# Patient Record
Sex: Male | Born: 1975 | Race: White | Hispanic: No | Marital: Married | Smoking: Former smoker
Health system: Southern US, Community
[De-identification: ages and names within clinical notes are randomized; demographics above are authoritative.]

## PROBLEM LIST (undated history)

## (undated) DIAGNOSIS — K2270 Barrett's esophagus without dysplasia: Secondary | ICD-10-CM

## (undated) DIAGNOSIS — I272 Pulmonary hypertension, unspecified: Secondary | ICD-10-CM

## (undated) DIAGNOSIS — K219 Gastro-esophageal reflux disease without esophagitis: Secondary | ICD-10-CM

## (undated) DIAGNOSIS — Z8719 Personal history of other diseases of the digestive system: Secondary | ICD-10-CM

## (undated) DIAGNOSIS — R079 Chest pain, unspecified: Secondary | ICD-10-CM

## (undated) HISTORY — PX: KNEE SURGERY: SHX244

## (undated) HISTORY — PX: WISDOM TOOTH EXTRACTION: SHX21

## (undated) HISTORY — PX: TUMOR REMOVAL: SHX12

---

## 2016-02-09 ENCOUNTER — Encounter (HOSPITAL_COMMUNITY): Payer: Self-pay | Admitting: Emergency Medicine

## 2016-02-09 ENCOUNTER — Emergency Department (HOSPITAL_COMMUNITY): Payer: 59

## 2016-02-09 ENCOUNTER — Observation Stay (HOSPITAL_COMMUNITY): Payer: 59

## 2016-02-09 ENCOUNTER — Observation Stay (HOSPITAL_COMMUNITY)
Admission: EM | Admit: 2016-02-09 | Discharge: 2016-02-10 | Disposition: A | Discharge status: Home / Self Care | Payer: 59 | Attending: Family Medicine | Admitting: Family Medicine

## 2016-02-09 DIAGNOSIS — K449 Diaphragmatic hernia without obstruction or gangrene: Secondary | ICD-10-CM | POA: Insufficient documentation

## 2016-02-09 DIAGNOSIS — E860 Dehydration: Secondary | ICD-10-CM | POA: Diagnosis not present

## 2016-02-09 DIAGNOSIS — K529 Noninfective gastroenteritis and colitis, unspecified: Secondary | ICD-10-CM | POA: Diagnosis not present

## 2016-02-09 DIAGNOSIS — K2270 Barrett's esophagus without dysplasia: Secondary | ICD-10-CM | POA: Diagnosis not present

## 2016-02-09 DIAGNOSIS — K210 Gastro-esophageal reflux disease with esophagitis: Secondary | ICD-10-CM

## 2016-02-09 DIAGNOSIS — R000 Tachycardia, unspecified: Secondary | ICD-10-CM | POA: Diagnosis not present

## 2016-02-09 DIAGNOSIS — K219 Gastro-esophageal reflux disease without esophagitis: Secondary | ICD-10-CM | POA: Insufficient documentation

## 2016-02-09 HISTORY — DX: Barrett's esophagus without dysplasia: K22.70

## 2016-02-09 LAB — COMPREHENSIVE METABOLIC PANEL
ALBUMIN: 4.4 g/dL (ref 3.5–5.0)
ALT: 34 U/L (ref 17–63)
ANION GAP: 10 (ref 5–15)
AST: 32 U/L (ref 15–41)
Alkaline Phosphatase: 105 U/L (ref 38–126)
BUN: 17 mg/dL (ref 6–20)
CHLORIDE: 104 mmol/L (ref 101–111)
CO2: 22 mmol/L (ref 22–32)
Calcium: 9.7 mg/dL (ref 8.9–10.3)
Creatinine, Ser: 1.17 mg/dL (ref 0.61–1.24)
GFR calc Af Amer: >60 mL/min (ref >60)
GFR calc non Af Amer: >60 mL/min (ref >60)
GLUCOSE: 113 mg/dL — AB (ref 65–99)
Potassium: 4.3 mmol/L (ref 3.5–5.1)
SODIUM: 136 mmol/L (ref 135–145)
Total Bilirubin: 0.8 mg/dL (ref 0.3–1.2)
Total Protein: 8 g/dL (ref 6.5–8.1)

## 2016-02-09 LAB — URINALYSIS, ROUTINE W REFLEX MICROSCOPIC
Bilirubin Urine: NEGATIVE
GLUCOSE, UA: NEGATIVE mg/dL
KETONES UR: NEGATIVE mg/dL
LEUKOCYTES UA: NEGATIVE
NITRITE: NEGATIVE
PH: 6.5 (ref 5.0–8.0)
PROTEIN: NEGATIVE mg/dL
Specific Gravity, Urine: 1.033 — ABNORMAL HIGH (ref 1.005–1.030)

## 2016-02-09 LAB — TROPONIN I
Troponin I: <0.03 ng/mL (ref <0.03)
Troponin I: <0.03 ng/mL (ref <0.03)

## 2016-02-09 LAB — CBC WITH DIFFERENTIAL/PLATELET
BASOS ABS: 0 10*3/uL (ref 0.0–0.1)
BASOS PCT: 0 %
EOS ABS: 0.2 10*3/uL (ref 0.0–0.7)
Eosinophils Relative: 1 %
HEMATOCRIT: 45.7 % (ref 39.0–52.0)
HEMOGLOBIN: 15.5 g/dL (ref 13.0–17.0)
LYMPHS PCT: 9 %
Lymphs Abs: 1.7 10*3/uL (ref 0.7–4.0)
MCH: 30.5 pg (ref 26.0–34.0)
MCHC: 33.9 g/dL (ref 30.0–36.0)
MCV: 89.8 fL (ref 78.0–100.0)
MONOS PCT: 8 %
Monocytes Absolute: 1.5 10*3/uL — ABNORMAL HIGH (ref 0.1–1.0)
NEUTROS ABS: 15.1 10*3/uL — AB (ref 1.7–7.7)
NEUTROS PCT: 82 %
Platelets: 322 10*3/uL (ref 150–400)
RBC: 5.09 MIL/uL (ref 4.22–5.81)
RDW: 12.8 % (ref 11.5–15.5)
WBC: 18.5 10*3/uL — ABNORMAL HIGH (ref 4.0–10.5)

## 2016-02-09 LAB — URINE MICROSCOPIC-ADD ON: Squamous Epithelial / LPF: NONE SEEN

## 2016-02-09 LAB — MAGNESIUM: MAGNESIUM: 2.1 mg/dL (ref 1.7–2.4)

## 2016-02-09 LAB — C DIFFICILE QUICK SCREEN W PCR REFLEX
C DIFFICILE (CDIFF) INTERP: NOT DETECTED
C DIFFICILE (CDIFF) TOXIN: NEGATIVE
C DIFFICLE (CDIFF) ANTIGEN: NEGATIVE

## 2016-02-09 LAB — OCCULT BLOOD X 1 CARD TO LAB, STOOL: FECAL OCCULT BLD: POSITIVE — AB

## 2016-02-09 LAB — I-STAT CG4 LACTIC ACID, ED: LACTIC ACID, VENOUS: 0.97 mmol/L (ref 0.5–1.9)

## 2016-02-09 LAB — LIPASE, BLOOD: Lipase: 23 U/L (ref 11–51)

## 2016-02-09 LAB — D-DIMER, QUANTITATIVE: D-Dimer, Quant: 1.76 ug/mL-FEU — ABNORMAL HIGH (ref 0.00–0.50)

## 2016-02-09 LAB — TSH: TSH: 0.472 u[IU]/mL (ref 0.350–4.500)

## 2016-02-09 MED ORDER — IOPAMIDOL (ISOVUE-300) INJECTION 61%
INTRAVENOUS | Status: AC
  Filled 2016-02-09: qty 100

## 2016-02-09 MED ORDER — METRONIDAZOLE IN NACL 5-0.79 MG/ML-% IV SOLN: 500.0000 mg | Freq: Once | INTRAVENOUS | Status: DC

## 2016-02-09 MED ORDER — ACETAMINOPHEN 325 MG PO TABS
650.0000 mg | ORAL_TABLET | Freq: Four times a day (QID) | ORAL | Status: DC | PRN
  Administered 2016-02-09: 650 mg via ORAL
  Filled 2016-02-09: qty 2

## 2016-02-09 MED ORDER — ACETAMINOPHEN 650 MG RE SUPP: 650.0000 mg | Freq: Four times a day (QID) | RECTAL | Status: DC | PRN

## 2016-02-09 MED ORDER — ONDANSETRON HCL 4 MG/2ML IJ SOLN: 4.0000 mg | Freq: Four times a day (QID) | INTRAMUSCULAR | Status: DC | PRN

## 2016-02-09 MED ORDER — HYDROMORPHONE HCL 1 MG/ML IJ SOLN
1.0000 mg | Freq: Once | INTRAMUSCULAR | Status: AC
  Administered 2016-02-09: 1 mg via INTRAVENOUS
  Filled 2016-02-09: qty 1

## 2016-02-09 MED ORDER — SODIUM CHLORIDE 0.9 % IJ SOLN
INTRAMUSCULAR | Status: AC
  Administered 2016-02-09: 21:00:00
  Filled 2016-02-09: qty 50

## 2016-02-09 MED ORDER — SODIUM CHLORIDE 0.9 % IV BOLUS (SEPSIS)
2000.0000 mL | Freq: Once | INTRAVENOUS | Status: AC
  Administered 2016-02-09: 2000 mL via INTRAVENOUS

## 2016-02-09 MED ORDER — SODIUM CHLORIDE 0.9 % IV SOLN
INTRAVENOUS | Status: DC
  Administered 2016-02-09 – 2016-02-10 (×3): via INTRAVENOUS

## 2016-02-09 MED ORDER — ONDANSETRON HCL 4 MG PO TABS: 4.0000 mg | ORAL_TABLET | Freq: Four times a day (QID) | ORAL | Status: DC | PRN

## 2016-02-09 MED ORDER — SODIUM CHLORIDE 0.9 % IV BOLUS (SEPSIS)
1000.0000 mL | Freq: Once | INTRAVENOUS | Status: AC
  Administered 2016-02-09: 1000 mL via INTRAVENOUS

## 2016-02-09 MED ORDER — ONDANSETRON HCL 4 MG/2ML IJ SOLN
4.0000 mg | Freq: Once | INTRAMUSCULAR | Status: AC
  Administered 2016-02-09: 4 mg via INTRAVENOUS
  Filled 2016-02-09: qty 2

## 2016-02-09 MED ORDER — CIPROFLOXACIN IN D5W 400 MG/200ML IV SOLN
400.0000 mg | Freq: Once | INTRAVENOUS | Status: DC
  Filled 2016-02-09: qty 200

## 2016-02-09 MED ORDER — PANTOPRAZOLE SODIUM 40 MG PO TBEC
40.0000 mg | DELAYED_RELEASE_TABLET | Freq: Every day | ORAL | Status: DC
  Administered 2016-02-09 – 2016-02-10 (×2): 40 mg via ORAL
  Filled 2016-02-09 (×2): qty 1

## 2016-02-09 MED ORDER — CIPROFLOXACIN IN D5W 400 MG/200ML IV SOLN
400.0000 mg | Freq: Once | INTRAVENOUS | Status: AC
  Administered 2016-02-09: 400 mg via INTRAVENOUS

## 2016-02-09 MED ORDER — LORAZEPAM 2 MG/ML IJ SOLN
1.0000 mg | Freq: Once | INTRAMUSCULAR | Status: AC
  Administered 2016-02-09: 1 mg via INTRAVENOUS
  Filled 2016-02-09: qty 1

## 2016-02-09 MED ORDER — GI COCKTAIL ~~LOC~~
ORAL | Status: AC
  Administered 2016-02-09: 30 mL via ORAL
  Filled 2016-02-09: qty 30

## 2016-02-09 MED ORDER — METRONIDAZOLE IN NACL 5-0.79 MG/ML-% IV SOLN: 500.0000 mg | Freq: Three times a day (TID) | INTRAVENOUS | Status: DC

## 2016-02-09 MED ORDER — IOPAMIDOL (ISOVUE-370) INJECTION 76%
100.0000 mL | Freq: Once | INTRAVENOUS | Status: AC | PRN
  Administered 2016-02-09: 80 mL via INTRAVENOUS

## 2016-02-09 MED ORDER — METRONIDAZOLE IN NACL 5-0.79 MG/ML-% IV SOLN
500.0000 mg | Freq: Three times a day (TID) | INTRAVENOUS | Status: DC
  Administered 2016-02-09 – 2016-02-10 (×3): 500 mg via INTRAVENOUS
  Filled 2016-02-09 (×3): qty 100

## 2016-02-09 MED ORDER — SODIUM CHLORIDE 0.9 % IJ SOLN
INTRAMUSCULAR | Status: AC
  Filled 2016-02-09: qty 50

## 2016-02-09 MED ORDER — LOPERAMIDE HCL 2 MG PO CAPS
4.0000 mg | ORAL_CAPSULE | Freq: Once | ORAL | Status: AC
  Administered 2016-02-09: 4 mg via ORAL
  Filled 2016-02-09: qty 2

## 2016-02-09 MED ORDER — GI COCKTAIL ~~LOC~~
30.0000 mL | Freq: Once | ORAL | Status: AC
  Administered 2016-02-09: 30 mL via ORAL

## 2016-02-09 MED ORDER — METRONIDAZOLE IN NACL 5-0.79 MG/ML-% IV SOLN
500.0000 mg | Freq: Once | INTRAVENOUS | Status: DC
  Filled 2016-02-09: qty 100

## 2016-02-09 MED ORDER — IOPAMIDOL (ISOVUE-370) INJECTION 76%
INTRAVENOUS | Status: AC
  Filled 2016-02-09: qty 100

## 2016-02-09 MED ORDER — CIPROFLOXACIN IN D5W 400 MG/200ML IV SOLN: 400.0000 mg | Freq: Once | INTRAVENOUS | Status: DC

## 2016-02-09 MED ORDER — HYDROCODONE-ACETAMINOPHEN 5-325 MG PO TABS
1.0000 | ORAL_TABLET | ORAL | Status: DC | PRN
  Administered 2016-02-09 – 2016-02-10 (×5): 2 via ORAL
  Filled 2016-02-09 (×5): qty 2

## 2016-02-09 MED ORDER — METRONIDAZOLE IN NACL 5-0.79 MG/ML-% IV SOLN
500.0000 mg | Freq: Once | INTRAVENOUS | Status: AC
  Administered 2016-02-09: 500 mg via INTRAVENOUS

## 2016-02-09 MED ORDER — CIPROFLOXACIN IN D5W 400 MG/200ML IV SOLN
400.0000 mg | Freq: Two times a day (BID) | INTRAVENOUS | Status: DC
  Administered 2016-02-09 – 2016-02-10 (×2): 400 mg via INTRAVENOUS
  Filled 2016-02-09 (×2): qty 200

## 2016-02-09 MED ORDER — IOPAMIDOL (ISOVUE-300) INJECTION 61%
100.0000 mL | Freq: Once | INTRAVENOUS | Status: AC | PRN
  Administered 2016-02-09: 100 mL via INTRAVENOUS

## 2016-02-09 NOTE — ED Triage Notes (Signed)
Pt reports having RUQ pain that started yesterday around 1200 then began having chest pain substernally 2 hours ago. Pt reports prior hx of gallbladder stones and also 20 episodes of diarrhea in the last 24hr.

## 2016-02-09 NOTE — ED Provider Notes (Signed)
[facility]-EMERGENCY DEPT Provider Note   CSN: [id number] Arrival date & time: 02/09/16  4098  By signing my name below, I, Octavia Heir, attest that this documentation has been prepared under the direction and in the presence of Derwood Kaplan, MD.  Electronically Signed: Octavia Heir, ED Scribe. 02/09/16. 3:09 AM.    History   Chief Complaint Chief Complaint  Patient presents with  . Abdominal Pain    The history is provided by the patient. No language interpreter was used.   HPI Comments: Victor David is a 40 y.o. male who has a PMhx of GERD presents to the Emergency Department complaining of sudden onset, gradual worsening, waxing and waning, epigastric abdominal pain ~ 12 pm this afternoon. Associated nausea, diarrhea x 20, and diaphoresis. Pt describes his diarrhea as watery and pasty. He states his pain started off as a dull pain and has become gradually worse ~ 3 hours ago. Pt states his pain became increasingly worse after pt ate dinner tonight ~ 5 pm. He reports eating ribs. He notes he has frequent abdominal problems associated with his GERD. Pt notes having discomfort every time he eats but has not had prior episodes where the pain is "debilitating". Pt has no abdominal surgical hx. Denies vomiting, pain with breathing, and hx of DM. Pt has a family hx of MI, CVA, and DM.  Past Medical History:  Diagnosis Date  . Barrett's esophagus     Patient Active Problem List   Diagnosis Date Noted  . Colitis 02/09/2016  . Tachycardia 02/09/2016  . Dehydration 02/09/2016  . GERD (gastroesophageal reflux disease) 02/09/2016    History reviewed. No pertinent surgical history.     Home Medications    Prior to Admission medications   Medication Sig Start Date End Date Taking? Authorizing Provider  esomeprazole (NEXIUM) 20 MG packet Take 20 mg by mouth daily before breakfast.   Yes Historical Provider, MD  famotidine (PEPCID AC) 10 MG chewable tablet Chew 10 mg by mouth 2  (two) times daily as needed (stomach pain).   Yes Historical Provider, MD  ibuprofen (ADVIL,MOTRIN) 200 MG tablet Take 400 mg by mouth every 6 (six) hours as needed for headache, mild pain or moderate pain.   Yes Historical Provider, MD  ciprofloxacin (CIPRO) 500 MG tablet Take 1 tablet (500 mg total) by mouth 2 (two) times daily. 02/10/16   Tyrone Nine, MD  metroNIDAZOLE (FLAGYL) 500 MG tablet Take 1 tablet (500 mg total) by mouth every 8 (eight) hours. 02/10/16   Tyrone Nine, MD    Family History History reviewed. No pertinent family history.  Social History Social History  Substance Use Topics  . Smoking status: Never Smoker  . Smokeless tobacco: Never Used  . Alcohol use No     Allergies   Patient has no known allergies.   Review of Systems Review of Systems  A complete 10 system review of systems was obtained and all systems are negative except as noted in the HPI and PMH.   Physical Exam Updated Vital Signs BP 124/74 (BP Location: Left Arm)   Pulse 65   Temp 97.5 F (36.4 C) (Oral)   Resp 18   Ht 5\' 7"  (1.702 m)   Wt 263 lb 14.3 oz (119.7 kg)   SpO2 100%   BMI 41.33 kg/m   Physical Exam  Constitutional: He is oriented to person, place, and time. He appears well-developed and well-nourished.  HENT:  Head: Normocephalic.  Eyes: EOM are normal.  Neck: Normal range of motion.  Cardiovascular: Regular rhythm.  Tachycardia present.   Pulmonary/Chest: Effort normal.  Anterior lungs are clear  Abdominal: He exhibits no distension. There is tenderness. There is guarding. There is no rebound.  Pt has tenderness to epigastrium and RUQ with voluntary guarding, no rebound.  Musculoskeletal: Normal range of motion.  Neurological: He is alert and oriented to person, place, and time.  Psychiatric: He has a normal mood and affect.  Nursing note and vitals reviewed.    ED Treatments / Results  DIAGNOSTIC STUDIES: Oxygen Saturation is 98% on RA, normal by my  interpretation.  COORDINATION OF CARE:  3:07 AM Discussed treatment plan with pt at bedside and pt agreed to plan.  Labs (all labs ordered are listed, but only abnormal results are displayed) Labs Reviewed  COMPREHENSIVE METABOLIC PANEL - Abnormal; Notable for the following:       Result Value   Glucose, Bld 113 (*)    All other components within normal limits  URINALYSIS, ROUTINE W REFLEX MICROSCOPIC (NOT AT [facility]) - Abnormal; Notable for the following:    Specific Gravity, Urine 1.033 (*)    Hgb urine dipstick SMALL (*)    All other components within normal limits  CBC WITH DIFFERENTIAL/PLATELET - Abnormal; Notable for the following:    WBC 18.5 (*)    Neutro Abs 15.1 (*)    Monocytes Absolute 1.5 (*)    All other components within normal limits  URINE MICROSCOPIC-ADD ON - Abnormal; Notable for the following:    Bacteria, UA RARE (*)    All other components within normal limits  D-DIMER, QUANTITATIVE (NOT AT [facility]) - Abnormal; Notable for the following:    D-Dimer, Quant 1.76 (*)    All other components within normal limits  OCCULT BLOOD X 1 CARD TO LAB, STOOL - Abnormal; Notable for the following:    Fecal Occult Bld POSITIVE (*)    All other components within normal limits  BASIC METABOLIC PANEL - Abnormal; Notable for the following:    Glucose, Bld 105 (*)    Calcium 8.6 (*)    All other components within normal limits  URINE CULTURE  OVA + PARASITE EXAM  C DIFFICILE QUICK SCREEN W PCR REFLEX  LIPASE, BLOOD  TROPONIN I  TSH  MAGNESIUM  TROPONIN I  TROPONIN I  TROPONIN I  CBC  O&P RESULT  FECAL LACTOFERRIN, QUANT  I-STAT CHEM 8, ED  I-STAT CG4 LACTIC ACID, ED    EKG  EKG Interpretation  Date/Time:  Sunday February 09 2016 02:32:12 EST Ventricular Rate:  125 PR Interval:    QRS Duration: 99 QT Interval:  310 QTC Calculation: 447 R Axis:   0 Text Interpretation:  Sinus tachycardia Probable left atrial enlargement No acute changes No old tracing to  compare Confirmed by Rhunette CroftNANAVATI, MD, Janey GentaANKIT [phone]([id number]) on 02/09/2016 3:10:23 AM       Radiology No results found.  Procedures Procedures (including critical care time)  Medications Ordered in ED Medications  iopamidol (ISOVUE-300) 61 % injection (not administered)  sodium chloride 0.9 % injection (not administered)  iopamidol (ISOVUE-370) 76 % injection (not administered)  sodium chloride 0.9 % bolus 2,000 mL (0 mLs Intravenous Stopped 02/09/16 0803)  HYDROmorphone (DILAUDID) injection 1 mg (1 mg Intravenous Given 02/09/16 0317)  ondansetron (ZOFRAN) injection 4 mg (4 mg Intravenous Given 02/09/16 0318)  iopamidol (ISOVUE-300) 61 % injection 100 mL (100 mLs Intravenous Contrast Given 02/09/16 0430)  HYDROmorphone (DILAUDID) injection 1 mg (1 mg  Intravenous Given 02/09/16 0526)  ciprofloxacin (CIPRO) IVPB 400 mg (0 mg Intravenous Stopped 02/09/16 0706)  metroNIDAZOLE (FLAGYL) IVPB 500 mg (0 mg Intravenous Stopped 02/09/16 0803)  gi cocktail (Maalox,Lidocaine,Donnatal) (30 mLs Oral Given 02/09/16 0542)  LORazepam (ATIVAN) injection 1 mg (1 mg Intravenous Given 02/09/16 0717)  sodium chloride 0.9 % bolus 1,000 mL (1,000 mLs Intravenous Given 02/09/16 1003)  loperamide (IMODIUM) capsule 4 mg (4 mg Oral Given 02/09/16 1345)  iopamidol (ISOVUE-370) 76 % injection 100 mL (80 mLs Intravenous Contrast Given 02/09/16 2013)  sodium chloride 0.9 % injection (  Given 02/09/16 2127)     Initial Impression / Assessment and Plan / ED Course  I have reviewed the triage vital signs and the nursing notes.  Pertinent labs & imaging results that were available during my care of the patient were reviewed by me and considered in my medical decision making (see chart for details).  Clinical Course as of Feb 14 1438  Wynelle LinkSun Feb 09, 2016  [id number] CT scan shows possible colitis. Pt did mention 20 loose BM, so colitis in deep possible. With WC at 18, we will give antibiotics. Still tachycardic. Will give ativan 1 mg,  reports hx of anxiety. No chest pain even now. Repeat exam unchanged. Pt is having "waves of pain." With neg pancreatitis/perforation/cholecystitis - I think esophagitis/gastritis possible, especially since he has hx of barrets.    [AN]  0801 HR in the 130s despite pain meds, ativan. Pt continues to have abd pain. We will admit for pain obs - tachycardia/leukocytosis and persistent pain.  Mother had PE. No signs of DVT. Pt has no hx of PE, DVT and denies any exogenous gormone use, long distance travels or surgery in the past 6 weeks, active cancer, recent immobilization. We will get dimer as a screening exam. IF DIMER IS + - PT WILL NEED VQ SCAN.   [AN]    Clinical Course User Index [AN] Derwood KaplanAnkit Dulce Martian, MD    I personally performed the services described in this documentation, which was scribed in my presence. The recorded information has been reviewed and is accurate.   Final Clinical Impressions(s) / ED Diagnoses   Final diagnoses:  Colitis, acute  Tachycardia    Pt comes in w/ abd pain. Hx of gall stones and recurrent episodes of GERD. Pt is tachycardic and has epigastric tenderness, RUQ tenderness. He is diaphoretic  DDx includes: Pancreatitis Hepatobiliary pathology including cholecystitis Gastritis/PUD SBO ACS syndrome Aortic Dissection Ureteral colic  CT abdomen ordered. Pain control started.   New Prescriptions Discharge Medication List as of 02/10/2016  2:11 PM    START taking these medications   Details  ciprofloxacin (CIPRO) 500 MG tablet Take 1 tablet (500 mg total) by mouth 2 (two) times daily., Starting Mon 02/10/2016, Normal    metroNIDAZOLE (FLAGYL) 500 MG tablet Take 1 tablet (500 mg total) by mouth every 8 (eight) hours., Starting Mon 02/10/2016, Normal         Derwood KaplanAnkit Shyloh Derosa, MD 02/15/16 1440

## 2016-02-09 NOTE — ED Notes (Signed)
Secretary reports that 4th floor has not approved bed that was assigned

## 2016-02-09 NOTE — ED Notes (Signed)
Phlebotomy at bedside to collect d-dimer

## 2016-02-09 NOTE — Progress Notes (Signed)
Pharmacy Antibiotic Note  Eldridge AbrahamsWesley Henken is a 40 y.o. male admitted on 02/09/2016 with intra-abdominal infection.  Pharmacy has been consulted for Metronidazole dosing.  Plan: Metronidazole 500mg  IV q8h Anticipate dosage to remain stable and need for further dosage adjustment appears unlikely at present.   Will sign off at this time.  Please reconsult if a change in clinical status warrants re-evaluation of dosage.    Height: 6' (182.9 cm) Weight: 257 lb (116.6 kg) IBW/kg (Calculated) : 77.6  Temp (24hrs), Avg:99.2 F (37.3 C), Min:99.2 F (37.3 C), Max:99.2 F (37.3 C)   Recent Labs Lab 02/09/16 0332 02/09/16 0517  WBC 18.5*  --   CREATININE 1.17  --   LATICACIDVEN  --  0.97    Estimated Creatinine Clearance: 110.6 mL/min (by C-G formula based on SCr of 1.17 mg/dL).    No Known Allergies  Antimicrobials this admission: 11/19 Cipro x 1 dose  11/19 Metronidazole >>    Thank you for allowing pharmacy to be a part of this patient's care.  Maryellen PilePoindexter, Maicol Bowland Trefz, PharmD 02/09/2016 6:04 AM

## 2016-02-09 NOTE — H&P (Signed)
History and Physical        Hospital Admission Note Date: 02/09/2016  Patient name: Victor David Medical record number: [id number] Date of birth: 12-12-1975 Age: 40 y.o. Gender: male  PCP: No primary care provider on file.   Referring physician:  Dr Rhunette Croft  Patient coming from: home    Chief Complaint:  Multiple episodes of diarrhea with abdominal cramping since yesterday  HPI: Patient is a 40 year old male with history of GERD, Barrett's esophagus presented to ER with abdominal pain, multiple episodes of diarrhea since yesterday. History was obtained from the patient who reported that he was fine until he had brunch at [facility] yesterday noon with eggs, hash brown and sausage. He started having nausea, dull crampy abdominal pain and diarrhea. However he still decided to eat ribs for dinner around 5 PM from [employer] and then his symptoms worsened considerably. He denies any fevers or chills but had diaphoresis, worsened abdominal pain, epigastric, 10 out of 10, intermittent, sharp and stabbing. About 20 episodes of watery bowel movement, with flecks of blood. He has not been able to eat anything since then. No vomiting.  He also has returned from [place] week ago from honeymoon, got married 2 weeks ago. He states that he did eat healthy over there.  ED work-up/course:  In ED, patient was noted to have a temp of 99.2, tachycardia, 130, BP 107/92  Labs showed WBCs 18.5, hemoglobin 15.5 BMET unremarkable. Lactic acid 0.97, troponin less than 0.03   EKG showed rate 125, sinus tachycardia CT abdomen showed minimal stranding surrounding the ascending colon with multiple right lower quadrant lymph nodes, nonspecific but indicates degree of colitis in the appropriate clinical context.  Review of Systems: Positives marked in 'bold' Constitutional: Denies fever, +chills,  + diaphoresis, + poor appetite and fatigue.  HEENT: Denies photophobia, eye pain, redness, hearing loss, ear pain, congestion, sore throat, rhinorrhea, sneezing, mouth sores, trouble swallowing, neck pain, neck stiffness and tinnitus.   Respiratory: Denies SOB, DOE, cough, chest tightness,  and wheezing.   Cardiovascular: Denies chest pain, palpitations and leg swelling.  Gastrointestinal: Please see history of present illness  Genitourinary: Denies dysuria, urgency, frequency, hematuria, flank pain and difficulty urinating.  Musculoskeletal: Denies myalgias, back pain, joint swelling, arthralgias and gait problem.  Skin: Denies pallor, rash and wound.  Neurological: Denies dizziness, seizures, syncope, weakness, light-headedness, numbness and headaches.  Hematological: Denies adenopathy. Easy bruising, personal or family bleeding history  Psychiatric/Behavioral: Denies suicidal ideation, mood changes, confusion, nervousness, sleep disturbance and agitation  Past Medical History: Past Medical History:  Diagnosis Date  . Barrett's esophagus     No past surgical history on file.  Medications: Prior to Admission medications   Medication Sig Start Date End Date Taking? Authorizing Provider  esomeprazole (NEXIUM) 20 MG packet Take 20 mg by mouth daily before breakfast.   Yes Historical Provider, MD  famotidine (PEPCID AC) 10 MG chewable tablet Chew 10 mg by mouth 2 (two) times daily as needed (stomach pain).   Yes Historical Provider, MD  ibuprofen (ADVIL,MOTRIN) 200 MG tablet Take 400 mg by mouth every 6 (six) hours as needed for headache, mild pain or moderate pain.  Yes Historical Provider, MD    Allergies:  No Known Allergies  Social History:  reports that he has never smoked. He has never used smokeless tobacco. He reports that he does not drink alcohol or use drugs.  Family History: No family history on file.  Physical Exam: Blood pressure 122/66, pulse (!) 130, temperature  99.2 F (37.3 C), temperature source Oral, resp. rate 24, height 6' (1.829 m), weight 116.6 kg (257 lb), SpO2 97 %. General: Alert, awake, oriented x3, in no acute distress. HEENT: normocephalic, atraumatic, anicteric sclera, pink conjunctiva, pupils equal and reactive to light and accomodation, oropharynx clear Neck: supple, no masses or lymphadenopathy, no goiter, no bruits  Heart:Sinus tachycardia, Regular rate and rhythm, without murmurs, rubs or gallops. Lungs: Clear to auscultation bilaterally, no wheezing, rales or rhonchi. Abdomen: Soft, Mild diffuse tenderness worse in epigastric region, nondistended, positive bowel sounds, no masses. Extremities: No clubbing, cyanosis or edema with positive pedal pulses. Neuro: Grossly intact, no focal neurological deficits, strength 5/5 upper and lower extremities bilaterally Psych: alert and oriented x 3, normal mood and affect Skin: no rashes or lesions, warm and dry   LABS on Admission:  Basic Metabolic Panel:  Recent Labs Lab 02/09/16 0332  NA 136  K 4.3  CL 104  CO2 22  GLUCOSE 113*  BUN 17  CREATININE 1.17  CALCIUM 9.7  MG 2.1   Liver Function Tests:  Recent Labs Lab 02/09/16 0332  AST 32  ALT 34  ALKPHOS 105  BILITOT 0.8  PROT 8.0  ALBUMIN 4.4    Recent Labs Lab 02/09/16 0332  LIPASE 23   No results for input(s): AMMONIA in the last 168 hours. CBC:  Recent Labs Lab 02/09/16 0332  WBC 18.5*  NEUTROABS 15.1*  HGB 15.5  HCT 45.7  MCV 89.8  PLT 322   Cardiac Enzymes:  Recent Labs Lab 02/09/16 0332  TROPONINI <0.03   BNP: Invalid input(s): POCBNP CBG: No results for input(s): GLUCAP in the last 168 hours.  Radiological Exams on Admission:  No results found.  *I have personally reviewed the images above*  EKG: Independently reviewed. Rate 125, sinus tachycardia*   Assessment/Plan Principal Problem:  Acute Colitis - Possibly infectious colitis - Obtain stool studies, including C.  difficile PCR, place on clear liquid diet, aggressive IV fluid hydration - Placed on IV ciprofloxacin and Flagyl  Active Problems:   Tachycardia, sinus - Troponins negative, likely due to significant dehydration, check TSH - Patient reports "history of blood clots" in mother, will check d-dimer, recent travel to [place]    Dehydration - Due to diarrhea, placed on aggressive IV fluid hydration    GERD (gastroesophageal reflux disease), Barrett's esophagus - Place on PPI  DVT prophylaxis: SCDs   CODE STATUS: Full CODE STATUS   Consults called: None   Family Communication: Admission, patients condition and plan of care including tests being ordered have been discussed with the patient and Wife who indicates understanding and agree with the plan and Code Status  Admission status: obs, Telemetry   Disposition plan: Further plan will depend as patient's clinical course evolves and further radiologic and laboratory data become available. Likely home when stable  At the time of admission, it appears that the appropriate admission status for this patient is Observation . This is judged to be reasonable and necessary in order to provide the required intensity of service to ensure the patient's safety given the presenting symptoms, physical exam findings, and initial radiographic and laboratory data  in the context of their chronic comorbidities.      Time Spent on Admission: 60mins   Lana Flaim M.D. [facility] 02/09/2016, 10:01 AM Pager: [phone]  If 7PM-7AM, please contact night-coverage [email or web address] Password [email or web address]

## 2016-02-10 DIAGNOSIS — K529 Noninfective gastroenteritis and colitis, unspecified: Secondary | ICD-10-CM | POA: Diagnosis not present

## 2016-02-10 LAB — URINE CULTURE: CULTURE: NO GROWTH

## 2016-02-10 LAB — CBC
HCT: 44 % (ref 39.0–52.0)
HEMOGLOBIN: 14.3 g/dL (ref 13.0–17.0)
MCH: 30 pg (ref 26.0–34.0)
MCHC: 32.5 g/dL (ref 30.0–36.0)
MCV: 92.4 fL (ref 78.0–100.0)
Platelets: 269 10*3/uL (ref 150–400)
RBC: 4.76 MIL/uL (ref 4.22–5.81)
RDW: 13.3 % (ref 11.5–15.5)
WBC: 10.2 10*3/uL (ref 4.0–10.5)

## 2016-02-10 LAB — BASIC METABOLIC PANEL
ANION GAP: 6 (ref 5–15)
BUN: 11 mg/dL (ref 6–20)
CALCIUM: 8.6 mg/dL — AB (ref 8.9–10.3)
CO2: 26 mmol/L (ref 22–32)
Chloride: 107 mmol/L (ref 101–111)
Creatinine, Ser: 0.92 mg/dL (ref 0.61–1.24)
GLUCOSE: 105 mg/dL — AB (ref 65–99)
Potassium: 4.3 mmol/L (ref 3.5–5.1)
SODIUM: 139 mmol/L (ref 135–145)

## 2016-02-10 MED ORDER — METRONIDAZOLE 500 MG PO TABS
500.0000 mg | ORAL_TABLET | Freq: Three times a day (TID) | ORAL | Status: DC
  Administered 2016-02-10: 500 mg via ORAL
  Filled 2016-02-10: qty 1

## 2016-02-10 MED ORDER — CIPROFLOXACIN HCL 500 MG PO TABS: 500.0000 mg | ORAL_TABLET | Freq: Two times a day (BID) | ORAL | Status: DC

## 2016-02-10 MED ORDER — CIPROFLOXACIN HCL 500 MG PO TABS: 500.0000 mg | ORAL_TABLET | Freq: Two times a day (BID) | ORAL | 0 refills | Status: DC

## 2016-02-10 MED ORDER — METRONIDAZOLE 500 MG PO TABS: 500.0000 mg | ORAL_TABLET | Freq: Three times a day (TID) | ORAL | 0 refills | Status: DC

## 2016-02-10 NOTE — Discharge Summary (Signed)
Physician Discharge Summary  Duvall Comes KGM:[id number] DOB: 1975/07/20 DOA: 02/09/2016  PCP: In [place], [place]  Admit date: 02/09/2016 Discharge date: 02/10/2016  Admitted From: Home Disposition: Home   Recommendations for Outpatient Follow-up:  1. Follow up with PCP in 1-2 weeks 2. Consider GI referral if symptoms recur. 3. Follow up stool ova & parasites (pending at discharge)  Home Health: None Equipment/Devices: None Discharge Condition: Stable CODE STATUS: Full Diet recommendation: Low residue, advance as directed, adequate fluids  Brief/Interim Summary: Victor David is a 40yo male with a history of GERD, Barrett's esophagus who presented to the ED with adominal pain, and multiple episodes of diarrhea since yesterday. He and his wife canceled a trip to the beach because he had been feeling lethargic for about 12 hours before he ate [facility] and noted nearly immediate abdominal cramping, nausea, and diarrhea. He then ate some ribs for dinner which further exacerbated these symptoms prompting presentation to the ED. He denies vomiting and endorsed flecks of red in the stool that resolved at admission. He had been to [place] last week for a week-long honeymoon. Temp on arrival 99.31F, HR 130's, BP 107/92 and WBC 18.5, lactate 0.97, and neg troponin. CTA chest (due to sinus tachycardia and elevated D-dimer) was negative for PE. CT abdomen showed minimal stranding surrounding the ascending colon with multiple right lower quadrant lymph nodes thought while nonspecific, to indicate colitis. IV antibiotics were started and he was observed overnight. The following morning he was taking po with resolution of abdominal pain and had only 1 loose stool without blood. Leukocytosis resolved, troponins remained negative. Creatinine improved 1.17 > 0.92. Cdiff was negative and stool ova & parasites is pending at discharge. He desires to be discharged. Antibiotics were transitioned to oral cipro and  flagyl, he is advised to follow up with his PCP in [place] or seek medical attention urgently if symptoms return.   Discharge Diagnoses:  Principal Problem:   Colitis Active Problems:   Tachycardia   Dehydration   GERD (gastroesophageal reflux disease)  Discharge Instructions Discharge Instructions    Call MD for:  persistant dizziness or light-headedness    Complete by:  As directed    Call MD for:  persistant nausea and vomiting    Complete by:  As directed    Call MD for:  severe uncontrolled pain    Complete by:  As directed    Discharge instructions    Complete by:  As directed    You were admitted for colitis - an infection of your colon. Since your symptoms have improved and you are able to maintain hydration by mouth and you are tolerating oral antibiotics, you can be discharged with the following recommendations:  - START taking CIPRO twice daily and FLAGYL three times daily, both for the next 6 days for a total of 7 days. You should not drink alcohol while taking flagyl.  - Follow up with you primary care doctor in the next 1 - 2 weeks or sooner if your symptoms return or you are unable to keep fluids down.   Increase activity slowly    Complete by:  As directed        Medication List    TAKE these medications   ciprofloxacin 500 MG tablet Commonly known as:  CIPRO Take 1 tablet (500 mg total) by mouth 2 (two) times daily.   esomeprazole 20 MG packet Commonly known as:  NEXIUM Take 20 mg by mouth daily before breakfast.  famotidine 10 MG chewable tablet Commonly known as:  PEPCID AC Chew 10 mg by mouth 2 (two) times daily as needed (stomach pain).   ibuprofen 200 MG tablet Commonly known as:  ADVIL,MOTRIN Take 400 mg by mouth every 6 (six) hours as needed for headache, mild pain or moderate pain.   metroNIDAZOLE 500 MG tablet Commonly known as:  FLAGYL Take 1 tablet (500 mg total) by mouth every 8 (eight) hours.      Follow-up Information    Primary  Care Provider Follow up.          No Known Allergies  Consultations:  None  Procedures/Studies: Ct Angio Chest Pe W Or Wo Contrast  Result Date: 02/09/2016 CLINICAL DATA:  Shortness of breath and tachycardia EXAM: CT ANGIOGRAPHY CHEST WITH CONTRAST TECHNIQUE: Multidetector CT imaging of the chest was performed using the standard protocol during bolus administration of intravenous contrast. Multiplanar CT image reconstructions and MIPs were obtained to evaluate the vascular anatomy. CONTRAST:  80 mL Isovue 370 nonionic COMPARISON:  None. FINDINGS: Cardiovascular: There is no demonstrable pulmonary embolus. There is no thoracic aortic aneurysm or dissection. The visualized great vessels appear normal. There is no appreciable pericardial thickening. Mediastinum/Nodes: Visualized thyroid appears normal. There is no appreciable thoracic adenopathy. There is a small hiatal hernia. Lungs/Pleura: There is atelectatic change in both lower lobes. There is no edema or consolidation evident. There is no pleural effusion or pleural thickening evident. Upper Abdomen: Visualized upper abdominal structures appear unremarkable. Musculoskeletal: There are no blastic or lytic bone lesions. Review of the MIP images confirms the above findings. IMPRESSION: No demonstrable pulmonary embolus. There is patchy lower lobe atelectatic change without edema or consolidation. No adenopathy. Small hiatal hernia. Electronically Signed   By: Bretta BangWilliam  Woodruff III M.D.   On: 02/09/2016 20:43   Ct Abdomen Pelvis W Contrast  Result Date: 02/09/2016 CLINICAL DATA:  Epigastric and right upper quadrant abdominal pain. Leukocytosis. EXAM: CT ABDOMEN AND PELVIS WITH CONTRAST TECHNIQUE: Multidetector CT imaging of the abdomen and pelvis was performed using the standard protocol following bolus administration of intravenous contrast. CONTRAST:  100mL ISOVUE-300 IOPAMIDOL (ISOVUE-300) INJECTION 61% COMPARISON:  None. FINDINGS: Lower chest:  No pulmonary nodules. No visible pleural or pericardial effusion. Hepatobiliary: Normal hepatic size and contours without focal liver lesion. No perihepatic ascites. No intra- or extrahepatic biliary dilatation. Normal gallbladder. Pancreas: Normal pancreatic contours and enhancement. No peripancreatic fluid collection or pancreatic ductal dilatation. Spleen: Normal. Adrenals/Urinary Tract: Normal adrenal glands. No hydronephrosis or solid renal mass. Stomach/Bowel: No abnormal bowel dilatation. There is minimal stranding surrounding the ascending colon with multiple right lower quadrant lymph nodes measuring up to 9 mm. No abdominal fluid collection. Appendix not visualized. Vascular/Lymphatic: Normal course and caliber of the major abdominal vessels. Multiple right lower quadrant lymph nodes measuring up to 9 mm. Reproductive: Normal prostate and seminal vesicles. Musculoskeletal: No lytic or blastic osseous lesion. Normal visualized extrathoracic and extraperitoneal soft tissues. Other: No contributory non-categorized findings. IMPRESSION: 1. Minimal stranding surrounding the ascending colon with multiple right lower quadrant lymph nodes measuring up to 9 mm. The findings are nonspecific but could indicate a degree of colitis in the appropriate clinical context. 2. Otherwise, normal CT of the abdomen and pelvis. Electronically Signed   By: Deatra RobinsonKevin  Herman M.D.   On: 02/09/2016 04:54     Subjective: Diarrhea diminished significantly. 1 loose BM in last 12 hours without blood or mucus. Drinking fine without any nausea or emesis. Abd pain improved. Ambulating.  Discharge Exam: Vitals:   02/09/16 2120 02/10/16 0608  BP: 138/81 124/74  Pulse: 94 65  Resp: 18 18  Temp: 98 F (36.7 C) 97.5 F (36.4 C)   Vitals:   02/09/16 1000 02/09/16 1509 02/09/16 2120 02/10/16 0608  BP: (!) 107/92 123/75 138/81 124/74  Pulse: (!) 115 (!) 111 94 65  Resp: 20 19 18 18   Temp: 100 F (37.8 C) 99.2 F (37.3 C) 98 F  (36.7 C) 97.5 F (36.4 C)  TempSrc:  Oral Oral Oral  SpO2: 99% 97% 96% 100%  Weight: 119.7 kg (263 lb 14.3 oz)     Height: 5\' 7"  (1.702 m)      General: Pt is alert, awake, not in acute distress Cardiovascular: RRR, S1/S2 +, no rubs, no gallops Respiratory: CTA bilaterally, no wheezing, no rhonchi Abdominal: Soft, mild epigastric tenderness without rebound or guarding, ND, bowel sounds + Extremities: no edema, no cyanosis  The results of significant diagnostics from this hospitalization (including imaging, microbiology, ancillary and laboratory) are listed below for reference.    Microbiology: Recent Results (from the past 240 hour(s))  Urine culture     Status: None   Collection Time: 02/09/16  4:16 AM  Result Value Ref Range Status   Specimen Description URINE, RANDOM  Final   Special Requests NONE  Final   Culture NO GROWTH Performed at [facility]   Final   Report Status 02/10/2016 FINAL  Final  C difficile quick scan w PCR reflex     Status: None   Collection Time: 02/09/16 10:11 AM  Result Value Ref Range Status   C Diff antigen NEGATIVE NEGATIVE Final   C Diff toxin NEGATIVE NEGATIVE Final   C Diff interpretation No C. difficile detected.  Final     Labs: BNP (last 3 results) No results for input(s): BNP in the last 8760 hours. Basic Metabolic Panel:  Recent Labs Lab 02/09/16 0332 02/10/16 0447  NA 136 139  K 4.3 4.3  CL 104 107  CO2 22 26  GLUCOSE 113* 105*  BUN 17 11  CREATININE 1.17 0.92  CALCIUM 9.7 8.6*  MG 2.1  --    Liver Function Tests:  Recent Labs Lab 02/09/16 0332  AST 32  ALT 34  ALKPHOS 105  BILITOT 0.8  PROT 8.0  ALBUMIN 4.4    Recent Labs Lab 02/09/16 0332  LIPASE 23   No results for input(s): AMMONIA in the last 168 hours. CBC:  Recent Labs Lab 02/09/16 0332 02/10/16 0447  WBC 18.5* 10.2  NEUTROABS 15.1*  --   HGB 15.5 14.3  HCT 45.7 44.0  MCV 89.8 92.4  PLT 322 269   Cardiac Enzymes:  Recent  Labs Lab 02/09/16 0332 02/09/16 0952 02/09/16 1513 02/09/16 2207  TROPONINI <0.03 <0.03 <0.03 <0.03   BNP: Invalid input(s): POCBNP CBG: No results for input(s): GLUCAP in the last 168 hours. D-Dimer  Recent Labs  02/09/16 0807  DDIMER 1.76*   Hgb A1c No results for input(s): HGBA1C in the last 72 hours. Lipid Profile No results for input(s): CHOL, HDL, LDLCALC, TRIG, CHOLHDL, LDLDIRECT in the last 72 hours. Thyroid function studies  Recent Labs  02/09/16 0952  TSH 0.472   Anemia work up No results for input(s): VITAMINB12, FOLATE, FERRITIN, TIBC, IRON, RETICCTPCT in the last 72 hours. Urinalysis    Component Value Date/Time   COLORURINE YELLOW 02/09/2016 0416   APPEARANCEUR CLEAR 02/09/2016 0416   LABSPEC 1.033 (H) 02/09/2016 2440  PHURINE 6.5 02/09/2016 0416   GLUCOSEU NEGATIVE 02/09/2016 0416   HGBUR SMALL (A) 02/09/2016 0416   BILIRUBINUR NEGATIVE 02/09/2016 0416   KETONESUR NEGATIVE 02/09/2016 0416   PROTEINUR NEGATIVE 02/09/2016 0416   NITRITE NEGATIVE 02/09/2016 0416   LEUKOCYTESUR NEGATIVE 02/09/2016 0416   Sepsis Labs Invalid input(s): PROCALCITONIN,  WBC,  LACTICIDVEN Microbiology Recent Results (from the past 240 hour(s))  Urine culture     Status: None   Collection Time: 02/09/16  4:16 AM  Result Value Ref Range Status   Specimen Description URINE, RANDOM  Final   Special Requests NONE  Final   Culture NO GROWTH [facility]   Final   Report Status 02/10/2016 FINAL  Final  C difficile quick scan w PCR reflex     Status: None   Collection Time: 02/09/16 10:11 AM  Result Value Ref Range Status   C Diff antigen NEGATIVE NEGATIVE Final   C Diff toxin NEGATIVE NEGATIVE Final   C Diff interpretation No C. difficile detected.  Final    Time coordinating discharge: Over 30 minutes  Hazeline Junkeryan Phyliss Hulick, MD  [facility] 02/10/2016, 1:41 PM Pager [phone]  If 7PM-7AM, please contact  night-coverage [email or web address] Password [email or web address]

## 2016-02-12 LAB — O&P RESULT

## 2016-02-12 LAB — OVA + PARASITE EXAM

## 2016-02-20 LAB — FECAL LACTOFERRIN, QUANT: LACTOFERRIN, FECAL, QUANT.: 128 ug/mL — AB (ref 0.00–7.24)

## 2017-04-10 DIAGNOSIS — Z79899 Other long term (current) drug therapy: Secondary | ICD-10-CM | POA: Insufficient documentation

## 2017-04-10 DIAGNOSIS — R0602 Shortness of breath: Secondary | ICD-10-CM | POA: Diagnosis not present

## 2017-04-10 DIAGNOSIS — R079 Chest pain, unspecified: Secondary | ICD-10-CM | POA: Diagnosis present

## 2017-04-10 DIAGNOSIS — I209 Angina pectoris, unspecified: Secondary | ICD-10-CM | POA: Insufficient documentation

## 2017-04-11 ENCOUNTER — Emergency Department (HOSPITAL_COMMUNITY)
Admission: EM | Admit: 2017-04-11 | Discharge: 2017-04-11 | Disposition: A | Discharge status: Home / Self Care | Payer: BLUE CROSS/BLUE SHIELD | Attending: Emergency Medicine | Admitting: Emergency Medicine

## 2017-04-11 ENCOUNTER — Encounter (HOSPITAL_COMMUNITY): Payer: Self-pay | Admitting: Emergency Medicine

## 2017-04-11 ENCOUNTER — Encounter: Payer: Self-pay | Admitting: Physician Assistant

## 2017-04-11 ENCOUNTER — Emergency Department (HOSPITAL_COMMUNITY): Payer: BLUE CROSS/BLUE SHIELD

## 2017-04-11 ENCOUNTER — Other Ambulatory Visit: Payer: Self-pay

## 2017-04-11 DIAGNOSIS — I209 Angina pectoris, unspecified: Secondary | ICD-10-CM

## 2017-04-11 DIAGNOSIS — R0602 Shortness of breath: Secondary | ICD-10-CM | POA: Diagnosis not present

## 2017-04-11 LAB — CBC
HCT: 43.6 % (ref 39.0–52.0)
Hemoglobin: 14.7 g/dL (ref 13.0–17.0)
MCH: 30.6 pg (ref 26.0–34.0)
MCHC: 33.7 g/dL (ref 30.0–36.0)
MCV: 90.8 fL (ref 78.0–100.0)
PLATELETS: 330 10*3/uL (ref 150–400)
RBC: 4.8 MIL/uL (ref 4.22–5.81)
RDW: 12.5 % (ref 11.5–15.5)
WBC: 10.9 10*3/uL — ABNORMAL HIGH (ref 4.0–10.5)

## 2017-04-11 LAB — I-STAT TROPONIN, ED
TROPONIN I, POC: 0 ng/mL (ref 0.00–0.08)
Troponin i, poc: 0 ng/mL (ref 0.00–0.08)

## 2017-04-11 LAB — BASIC METABOLIC PANEL
Anion gap: 8 (ref 5–15)
BUN: 17 mg/dL (ref 6–20)
CALCIUM: 9.5 mg/dL (ref 8.9–10.3)
CO2: 23 mmol/L (ref 22–32)
CREATININE: 0.79 mg/dL (ref 0.61–1.24)
Chloride: 106 mmol/L (ref 101–111)
GFR calc Af Amer: >60 mL/min (ref >60)
GFR calc non Af Amer: >60 mL/min (ref >60)
GLUCOSE: 112 mg/dL — AB (ref 65–99)
Potassium: 3.5 mmol/L (ref 3.5–5.1)
Sodium: 137 mmol/L (ref 135–145)

## 2017-04-11 MED ORDER — ASPIRIN 81 MG PO CHEW
324.0000 mg | CHEWABLE_TABLET | Freq: Once | ORAL | Status: AC
Start: 2017-04-11 — End: 2017-04-11
  Administered 2017-04-11: 324 mg via ORAL
  Filled 2017-04-11: qty 4

## 2017-04-11 NOTE — Discharge Instructions (Signed)

## 2017-04-11 NOTE — ED Provider Notes (Signed)
[facility]-EMERGENCY DEPT Provider Note   CSN: [id number] Arrival date & time: 04/10/17  2336     History   Chief Complaint Chief Complaint  Patient presents with  . Eye Pain  . Arm Pain    HPI Victor David is a 42 y.o. male.  HPI 42 year old male comes in with chief complaint of chest pain and arm pain.  Patient has history of Barrett's esophagus, and he also has family history of premature CAD.  Patient reports that yesterday he had 3 episodes of chest pain with radiation down the left arm.  Chest pain is described as tightness/pressure type pain on the left side.  Patient also had associated diaphoresis and dizziness.  Symptoms were usually unprovoked, and he could not remember any specific aggravating or relieving factors.  At nighttime the arm pain lasted for close to 2 hours, therefore he decided to come to the ER.  Patient does not have any neck pain and he denies any history of similar pain in the past.  Patient states that his father had triple vessel disease and required CABG in his 53s.  Patient also had grandparents who had heart attack at 30s and uncles who had heart attack in their 73s.  Past Medical History:  Diagnosis Date  . Barrett's esophagus     Patient Active Problem List   Diagnosis Date Noted  . Colitis 02/09/2016  . Tachycardia 02/09/2016  . Dehydration 02/09/2016  . GERD (gastroesophageal reflux disease) 02/09/2016    History reviewed. No pertinent surgical history.     Home Medications    Prior to Admission medications   Medication Sig Start Date End Date Taking? Authorizing Provider  ciprofloxacin (CIPRO) 500 MG tablet Take 1 tablet (500 mg total) by mouth 2 (two) times daily. 02/10/16   Tyrone Nine, MD  esomeprazole (NEXIUM) 20 MG packet Take 20 mg by mouth daily before breakfast.    [provider]  famotidine (PEPCID AC) 10 MG chewable tablet Chew 10 mg by mouth 2 (two) times daily as needed (stomach  pain).    [provider]  ibuprofen (ADVIL,MOTRIN) 200 MG tablet Take 400 mg by mouth every 6 (six) hours as needed for headache, mild pain or moderate pain.    [provider]  metroNIDAZOLE (FLAGYL) 500 MG tablet Take 1 tablet (500 mg total) by mouth every 8 (eight) hours. 02/10/16   Tyrone Nine, MD    Family History History reviewed. No pertinent family history.  Social History Social History   Tobacco Use  . Smoking status: Never Smoker  . Smokeless tobacco: Never Used  Substance Use Topics  . Alcohol use: No  . Drug use: No     Allergies   Patient has no known allergies.   Review of Systems Review of Systems  Constitutional: Positive for activity change.  Respiratory: Positive for chest tightness.   Cardiovascular: Positive for chest pain.  Neurological: Positive for dizziness.  All other systems reviewed and are negative.    Physical Exam Updated Vital Signs BP (!) 133/97 (BP Location: Left Arm)   Pulse 78   Temp 98.9 F (37.2 C) (Oral)   Resp 12   Ht 6' (1.829 m)   Wt 129.4 kg (285 lb 4.8 oz)   SpO2 98%   BMI 38.69 kg/m   Physical Exam  Constitutional: He is oriented to person, place, and time. He appears well-developed.  HENT:  Head: Atraumatic.  Neck: Neck supple.  Cardiovascular: Normal rate.  Pulmonary/Chest: Effort normal.  Abdominal: Soft.  Neurological: He is alert and oriented to person, place, and time.  Skin: Skin is warm.  Nursing note and vitals reviewed.    ED Treatments / Results  Labs (all labs ordered are listed, but only abnormal results are displayed) Labs Reviewed  BASIC METABOLIC PANEL - Abnormal; Notable for the following components:      Result Value   Glucose, Bld 112 (*)    All other components within normal limits  CBC - Abnormal; Notable for the following components:   WBC 10.9 (*)    All other components within normal limits  I-STAT TROPONIN, ED  I-STAT TROPONIN, ED    EKG  EKG  Interpretation  Date/Time:  Sunday April 11 2017 00:01:43 EST Ventricular Rate:  96 PR Interval:  174 QRS Duration: 102 QT Interval:  358 QTC Calculation: 452 R Axis:   23 Text Interpretation:  Normal sinus rhythm Normal ECG No acute changes No significant change since last tracing Confirmed by Ceara Wrightson ([id number]) on 04/11/2017 5:31:54 AM       Radiology Dg Chest 2 View  Result Date: 04/11/2017 CLINICAL DATA:  Left shoulder and arm pain with tingling and shortness of breath EXAM: CHEST  2 VIEW COMPARISON:  02/09/2016 FINDINGS: The heart size and mediastinal contours are within normal limits. Both lungs are clear. The visualized skeletal structures are unremarkable. IMPRESSION: No active cardiopulmonary disease. Electronically Signed   By: Taylor  Stroud M.D.   On: 04/11/2017 00:54    Procedures Procedures (including critical care time)  Medications Ordered in ED Medications  aspirin chewable tablet 324 mg (324 mg Oral Given 04/11/17 0649)     Initial Impression / Assessment and Plan / ED Course  I have reviewed the triage vital signs and the nursing notes.  Pertinent labs & imaging results that were available during my care of the patient were reviewed by me and considered in my medical decision making (see chart for details).     41  year old male comes in with chief complaint of chest pain.  Patient had 3 episodes of chest pain, with radiation down the left arm.  Symptoms were unprovoked.  Patient has history of Barrett's esophagitis, however he reports that he has never had symptoms like this in the past.  Patient has family history of CAD, and differential diagnosis at this time includes ACS, esophageal spasms.  Heart score is 3, patient gets 1 point for history and 2.5 risk factors. EKG is reassuring and troponins thus far negative.  Final Clinical Impressions(s) / ED Diagnoses   Final diagnoses:  Angina pectoris [facility](HCC)    ED Discharge Orders    None         Derwood KaplanNanavati, Amiya Escamilla, MD 04/11/17 [id number]

## 2017-04-11 NOTE — Progress Notes (Signed)
Rec'd request from cardiology fellow covering overnight that EDP requested f/u for this patient - sent message to schedulers to arrange early follow-up and asked that they call the patient. Soliana Kitko PA-C

## 2017-04-11 NOTE — ED Notes (Signed)
Pt c/o left arm pain and swelling onset 21:00. He endorsed SOB earlier today. No personal cardiac history, but family cardiac history.
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
## 2017-04-13 ENCOUNTER — Telehealth: Payer: Self-pay | Admitting: Adult Health

## 2017-04-13 ENCOUNTER — Encounter: Payer: Self-pay | Admitting: Adult Health

## 2017-04-13 NOTE — Progress Notes (Signed)
Cardiology Office Note   Date:  04/14/2017   ID:  Victor David, DOB [birth year], MRN [id number]  PCP:  Burton Apley, MD  Cardiologist:  New Dr. Herbie Baltimore  Chief Complaint  Patient presents with  . New Patient (Initial Visit)    chest pain  . Chest Pain     History of Present Illness: Victor David is a 42 y.o. very pleasant obese male who presents assessment and consultation of chest pain at the request of Dr. Burton Apley.  He has no prior cardiac history or prior cardiac testing.  The patient has a history of Barrett's esophagus and is on PPI. He has been followed by GI.    He was seen in the emergency room on 04/11/2017 for similar symptoms with associated diaphoresis and dizziness.  Patient had left arm pain as well.  This occurred when he was taking out the garbage to the curb.    He has a strong family history of premature coronary artery disease father who is had bypass grafting and heart attack in his early 54's , grandparents who had a artery disease and heart attacks beginning  in the ages 8 and above.  On evaluation in the emergency room, the patient EKG was normal without acute ST-T wave abnormalities.  Troponin was negative.  He is here for further evaluation of etiology of chest discomfort.  The patient is very sedentary, he works in the Music therapist for [employer], in [place], the lives in [place].  He has a 2 Hour drive daily, as well as sitting throughout the day in front of a computer.  He is not physically active.  The patient quit smoking 10 years ago, he put on a good bit of weight after he did so.  And he also stopped going to the gym.    Past Medical History:  Diagnosis Date  . Barrett's esophagus     No past surgical history on file.   Current Outpatient Medications  Medication Sig Dispense Refill  . ciprofloxacin (CIPRO) 500 MG tablet Take 1 tablet (500 mg total) by mouth 2 (two) times daily. 12 tablet 0  . esomeprazole  (NEXIUM) 20 MG packet Take 20 mg by mouth daily before breakfast.    . famotidine (PEPCID AC) 10 MG chewable tablet Chew 10 mg by mouth 2 (two) times daily as needed (stomach pain).    Marland Kitchen ibuprofen (ADVIL,MOTRIN) 200 MG tablet Take 400 mg by mouth every 6 (six) hours as needed for headache, mild pain or moderate pain.    . metroNIDAZOLE (FLAGYL) 500 MG tablet Take 1 tablet (500 mg total) by mouth every 8 (eight) hours. 18 tablet 0  . sildenafil (REVATIO) 20 MG tablet Take 20 mg by mouth as needed (take 2 tabs as needed).     No current facility-administered medications for this visit.     Allergies:   Patient has no known allergies.    Social History:  The patient  reports that  has never smoked. he has never used smokeless tobacco. He reports that he does not drink alcohol or use drugs.   Family History:  The patient's family history includes Heart attack in his father and paternal grandfather; High Cholesterol in his brother and brother.    ROS: All other systems are reviewed and negative. Unless otherwise mentioned in H&P    PHYSICAL EXAM: VS:  BP 140/90 (BP Location: Left Arm)   Pulse 87   Ht 6' (1.829 m)   Wt 280  lb 6.4 oz (127.2 kg)   BMI 38.03 kg/m  , BMI Body mass index is 38.03 kg/m. GEN: Well nourished, well developed, in no acute distress obese HEENT: normal  Neck: no JVD, carotid bruits, or masses Cardiac: RRR; no murmurs, rubs, or gallops,no edema  Respiratory:  clear to auscultation bilaterally, normal work of breathing GI: soft, nontender, nondistended, + BS MS: no deformity or atrophy  Skin: warm and dry, no rash Neuro:  Strength and sensation are intact Psych: euthymic mood, full affect   EKG: Normal sinus rhythm heart rate of 87 bpm.  Recent Labs: 04/11/2017: BUN 17; Creatinine, Ser 0.79; Hemoglobin 14.7; Platelets 330; Potassium 3.5; Sodium 137    Lipid Panel No results found for: CHOL, TRIG, HDL, CHOLHDL, VLDL, LDLCALC, LDLDIRECT    Wt Readings from  Last 3 Encounters:  04/14/17 280 lb 6.4 oz (127.2 kg)  04/10/17 285 lb 4.8 oz (129.4 kg)  02/09/16 263 lb 14.3 oz (119.7 kg)      Other studies Reviewed: None  AASSESSMENT AND PLAN:  1.  Chest discomfort with radiation to the left arm: Coronary artery risk factors include family history of premature coronary artery disease, hypercholesterolemia, moderate hypertension, obesity, sedentary lifestyle.   The patient will be scheduled for CT scan with FFR.  I discussed this with Dr[facility] BaltimoreHarding, [facility] who is also seen and evaluated the patient and is in agreement with my plan.  The patient will hold Revatio prior to CT scan.  Coated aspirin 81 mg daily.  2.  Hypercholesterolemia: Review of labs which were drawn in June 2018 revealed a total cholesterol 244, HDL of 44, triglycerides 151, LDL of 170.  Of note, these were not drawn fasting.  This will be repeated today in the clinic as he has been fasting.  Due to multiple cardiovascular risk factors and elevation 6 months ago, will begin statin therapy with atorvastatin 40 mg daily.  He is also given written instructions on a low-cholesterol heart healthy diet.  3.  Obesity: Significant risk factor along with sedentary lifestyle.  He has been given instructions to begin exercise regimen of 30 minutes a day of low level walking, he get up throughout the day from his desk and walk around.  He is advised on low-cholesterol diet which is given to him in written form.  Weight loss is imperative with all of his cardiovascular risk factors, for improvement in overall health status. Current medicines are reviewed at length with the patient today.    4.  Moderate hypertension: This is a new reading, will follow this closely.  May need to be placed on antihypertensive medication if remains elevated on follow-up visits.  On review of ER records blood pressure was 133/97.  Can consider adding diuretic along with ACE or beta-blocker if necessary on  follow-up.  If he has recurrent chest discomfort beta-blocker can be added for antianginal properties.  5..  History of Barrett's esophagus: He remains on PPI, H2 blocker, and has been on ciprofloxacin and metronidazole for positive H. pylori.  No hemoptysis, dysphagia, or esophageal spasming.    Labs/ tests ordered today include: Cardiac CT with FFR, fasting lipids LFTs, BMET, magnesium, TSH, and a hemoglobin A1c.  Bettey MareKathryn M. Liborio NixonLawrence DNP, ANP, AACC   04/14/2017 9:01 AM    [facility] [address], [place], [place] [address] Phone: [phone]; Fax: [phone]

## 2017-04-13 NOTE — Telephone Encounter (Signed)
NOTES FAXED TO [facility] °

## 2017-04-14 ENCOUNTER — Ambulatory Visit (INDEPENDENT_AMBULATORY_CARE_PROVIDER_SITE_OTHER): Payer: BLUE CROSS/BLUE SHIELD | Admitting: Adult Health

## 2017-04-14 ENCOUNTER — Encounter: Payer: Self-pay | Admitting: Adult Health

## 2017-04-14 VITALS — BP 140/90 | HR 87 | Ht 72.0 in | Wt 280.4 lb

## 2017-04-14 DIAGNOSIS — Z79899 Other long term (current) drug therapy: Secondary | ICD-10-CM

## 2017-04-14 DIAGNOSIS — I10 Essential (primary) hypertension: Secondary | ICD-10-CM | POA: Diagnosis not present

## 2017-04-14 DIAGNOSIS — E7800 Pure hypercholesterolemia, unspecified: Secondary | ICD-10-CM

## 2017-04-14 DIAGNOSIS — R079 Chest pain, unspecified: Secondary | ICD-10-CM | POA: Diagnosis not present

## 2017-04-14 LAB — MAGNESIUM: Magnesium: 2.5 mg/dL — ABNORMAL HIGH (ref 1.6–2.3)

## 2017-04-14 MED ORDER — ATORVASTATIN CALCIUM 40 MG PO TABS: 40.0000 mg | ORAL_TABLET | Freq: Every day | ORAL | 3 refills | Status: DC

## 2017-04-14 MED ORDER — ASPIRIN EC 81 MG PO TBEC: 81.0000 mg | DELAYED_RELEASE_TABLET | Freq: Every day | ORAL | 3 refills | Status: AC

## 2017-04-14 MED ORDER — METOPROLOL TARTRATE 50 MG PO TABS: 50.0000 mg | ORAL_TABLET | Freq: Once | ORAL | 0 refills | Status: DC

## 2017-04-14 NOTE — Patient Instructions (Addendum)
Medication Instructions:  START ATORVASTATIN 40MG  DAILY  START ASPIRIN 81MG  DAILY  If you need a refill on your cardiac medications before your next appointment, please call your pharmacy.  Labwork: LIPID, LFT, BMET, TSH, A1C, AND MAG TODAY -AND- IN 6 WEEKS FASTING LIPID AND LFT HERE IN OUR OFFICE AT [facility]  Take the provided lab slips for you to take with you to the lab for you blood draw.   You will need to fast. DO NOT EAT OR DRINK PAST MIDNIGHT.   You may go to any [facility] lab that is convenient for you however, we do have a lab in our office that is able to assist you. You do NOT need an appointment for our lab. Once in our office lobby there is a podium to the right of the check-in desk where you are to sign-in and ring a doorbell to alert [place] you are here. Lab is open Monday-Friday from 8:00am to 4:00pm; and is closed for lunch from 12:45p-1:45pm   Testing/Procedures: Your physician recommends that you have CT-CARDIAC W/FFR testing. See instructions below.  Special Instructions: MAKE SURE TO TRY TO WALK 30 MINUTES DAILY  Follow-Up: Your physician wants you to follow-up in: AFTER TESTING WITH DR Herbie Baltimore -OR- Joni Reining, DNP.    Thank you for choosing [facility] at [facility]!!    Please arrive at the [facility] main entrance of [facility] at xx:xx AM (30-45 minutes prior to test start time)  [facility] [address] [place], [place] [address] [phone]  Proceed to the [facility] Radiology Department (First Floor).  Please follow these instructions carefully (unless otherwise directed):  Hold all erectile dysfunction medications at least 48 hours prior to test.  On the Night Before the Test: . Drink plenty of water. . Do not consume any caffeinated/decaffeinated beverages or chocolate 12 hours prior to your test. . Do not take any antihistamines 12 hours prior to your test.  On the Day of the Test: . Drink plenty of water.  Do not drink any water within one hour of the test. . Do not eat any food 4 hours prior to the test. . You may take your regular medications prior to the test. . IF NOT ON A BETA BLOCKER - Take 50 mg of lopressor (metoprolol) one hour before the test.  After the Test: . Drink plenty of water. . After receiving IV contrast, you may experience a mild flushed feeling. This is normal. . On occasion, you may experience a mild rash up to 24 hours after the test. This is not dangerous. If this occurs, you can take Benadryl 25 mg and increase your fluid intake. . If you experience trouble breathing, this can be serious. If it is severe call 911 IMMEDIATELY. If it is mild, please call our office. . If you take any of these medications: Glipizide/Metformin, Avandament, Glucavance, please do not take 48 hours after completing test.   Fat and Cholesterol Restricted Diet Getting too much fat and cholesterol in your diet may cause health problems. Following this diet helps keep your fat and cholesterol at normal levels. This can keep you from getting sick. What types of fat should I choose?  Choose monosaturated and polyunsaturated fats. These are found in foods such as olive oil, canola oil, flaxseeds, walnuts, almonds, and seeds.  Eat more omega-3 fats. Good choices include salmon, mackerel, sardines, tuna, flaxseed oil, and ground flaxseeds.  Limit saturated fats. These are in animal products such  as meats, butter, and cream. They can also be in plant products such as palm oil, palm kernel oil, and coconut oil.  Avoid foods with partially hydrogenated oils in them. These contain trans fats. Examples of foods that have trans fats are stick margarine, some tub margarines, cookies, crackers, and other baked goods. What general guidelines do I need to follow?  Check food labels. Look for the words "trans fat" and "saturated fat."  When preparing a meal: ? Fill half of your plate with vegetables and  green salads. ? Fill one fourth of your plate with whole grains. Look for the word "whole" as the first word in the ingredient list. ? Fill one fourth of your plate with lean protein foods.  Eat more foods that have fiber, like apples, carrots, beans, peas, and barley.  Eat more home-cooked foods. Eat less at restaurants and buffets.  Limit or avoid alcohol.  Limit foods high in starch and sugar.  Limit fried foods.  Cook foods without frying them. Baking, boiling, grilling, and broiling are all great options.  Lose weight if you are overweight. Losing even a small amount of weight can help your overall health. It can also help prevent diseases such as diabetes and heart disease. What foods can I eat? Grains Whole grains, such as whole wheat or whole grain breads, crackers, cereals, and pasta. Unsweetened oatmeal, bulgur, barley, quinoa, or brown rice. Corn or whole wheat flour tortillas. Vegetables Fresh or frozen vegetables (raw, steamed, roasted, or grilled). Green salads. Fruits All fresh, canned (in natural juice), or frozen fruits. Meat and Other Protein Products Ground beef (85% or leaner), grass-fed beef, or beef trimmed of fat. Skinless chicken or [place]. Ground chicken or [place]. Pork trimmed of fat. All fish and seafood. Eggs. Dried beans, peas, or lentils. Unsalted nuts or seeds. Unsalted canned or dry beans. Dairy Low-fat dairy products, such as skim or 1% milk, 2% or reduced-fat cheeses, low-fat ricotta or cottage cheese, or plain low-fat yogurt. Fats and Oils Tub margarines without trans fats. Light or reduced-fat mayonnaise and salad dressings. Avocado. Olive, canola, sesame, or safflower oils. Natural peanut or almond butter (choose ones without added sugar and oil). The items listed above may not be a complete list of recommended foods or beverages. Contact your dietitian for more options. What foods are not recommended? Grains White bread. White pasta. White rice.  Cornbread. Bagels, pastries, and croissants. Crackers that contain trans fat. Vegetables White potatoes. Corn. Creamed or fried vegetables. Vegetables in a cheese sauce. Fruits Dried fruits. Canned fruit in light or heavy syrup. Fruit juice. Meat and Other Protein Products Fatty cuts of meat. Ribs, chicken wings, bacon, sausage, bologna, salami, chitterlings, fatback, hot dogs, bratwurst, and packaged luncheon meats. Liver and organ meats. Dairy Whole or 2% milk, cream, half-and-half, and cream cheese. Whole milk cheeses. Whole-fat or sweetened yogurt. Full-fat cheeses. Nondairy creamers and whipped toppings. Processed cheese, cheese spreads, or cheese curds. Sweets and Desserts Corn syrup, sugars, honey, and molasses. Candy. Jam and jelly. Syrup. Sweetened cereals. Cookies, pies, cakes, donuts, muffins, and ice cream. Fats and Oils Butter, stick margarine, lard, shortening, ghee, or bacon fat. Coconut, palm kernel, or palm oils. Beverages Alcohol. Sweetened drinks (such as sodas, lemonade, and fruit drinks or punches). The items listed above may not be a complete list of foods and beverages to avoid. Contact your dietitian for more information. This information is not intended to replace advice given to you by your health care provider. Make sure you  discuss any questions you have with your health care provider. Document Released: 09/08/2011 Document Revised: 11/14/2015 Document Reviewed: 06/08/2013 Elsevier Interactive Patient Education  2018 [employer].   Heart-Healthy Eating Plan Heart-healthy meal planning includes:  Limiting unhealthy fats.  Increasing healthy fats.  Making other small dietary changes.  You may need to talk with your doctor or a diet specialist (dietitian) to create an eating plan that is right for you. What types of fat should I choose?  Choose healthy fats. These include olive oil and canola oil, flaxseeds, walnuts, almonds, and seeds.  Eat more omega-3  fats. These include salmon, mackerel, sardines, tuna, flaxseed oil, and ground flaxseeds. Try to eat fish at least twice each week.  Limit saturated fats. ? Saturated fats are often found in animal products, such as meats, butter, and cream. ? Plant sources of saturated fats include palm oil, palm kernel oil, and coconut oil.  Avoid foods with partially hydrogenated oils in them. These include stick margarine, some tub margarines, cookies, crackers, and other baked goods. These contain trans fats. What general guidelines do I need to follow?  Check food labels carefully. Identify foods with trans fats or high amounts of saturated fat.  Fill one half of your plate with vegetables and green salads. Eat 4-5 servings of vegetables per day. A serving of vegetables is: ? 1 cup of raw leafy vegetables. ?  cup of raw or cooked cut-up vegetables. ?  cup of vegetable juice.  Fill one fourth of your plate with whole grains. Look for the word "whole" as the first word in the ingredient list.  Fill one fourth of your plate with lean protein foods.  Eat 4-5 servings of fruit per day. A serving of fruit is: ? One medium whole fruit. ?  cup of dried fruit. ?  cup of fresh, frozen, or canned fruit. ?  cup of 100% fruit juice.  Eat more foods that contain soluble fiber. These include apples, broccoli, carrots, beans, peas, and barley. Try to get 20-30 g of fiber per day.  Eat more home-cooked food. Eat less restaurant, buffet, and fast food.  Limit or avoid alcohol.  Limit foods high in starch and sugar.  Avoid fried foods.  Avoid frying your food. Try baking, boiling, grilling, or broiling it instead. You can also reduce fat by: ? Removing the skin from poultry. ? Removing all visible fats from meats. ? Skimming the fat off of stews, soups, and gravies before serving them. ? Steaming vegetables in water or broth.  Lose weight if you are overweight.  Eat 4-5 servings of nuts, legumes,  and seeds per week: ? One serving of dried beans or legumes equals  cup after being cooked. ? One serving of nuts equals 1 ounces. ? One serving of seeds equals  ounce or one tablespoon.  You may need to keep track of how much salt or sodium you eat. This is especially true if you have high blood pressure. Talk with your doctor or dietitian to get more information. What foods can I eat? Grains Breads, including [place], white, pita, wheat, raisin, rye, oatmeal, and [place]. Tortillas that are neither fried nor made with lard or trans fat. Low-fat rolls, including hotdog and hamburger buns and English muffins. Biscuits. Muffins. Waffles. Pancakes. Light popcorn. Whole-grain cereals. Flatbread. Melba toast. Pretzels. Breadsticks. Rusks. Low-fat snacks. Low-fat crackers, including oyster, saltine, matzo, graham, animal, and rye. Rice and pasta, including brown rice and pastas that are made with whole wheat.  Vegetables All vegetables. Fruits All fruits, but limit coconut. Meats and Other Protein Sources Lean, well-trimmed beef, veal, pork, and lamb. Chicken and [place] without skin. All fish and shellfish. Wild duck, rabbit, pheasant, and venison. Egg whites or low-cholesterol egg substitutes. Dried beans, peas, lentils, and tofu. Seeds and most nuts. Dairy Low-fat or nonfat cheeses, including ricotta, string, and mozzarella. Skim or 1% milk that is liquid, powdered, or evaporated. Buttermilk that is made with low-fat milk. Nonfat or low-fat yogurt. Beverages Mineral water. Diet carbonated beverages. Sweets and Desserts Sherbets and fruit ices. Honey, jam, marmalade, jelly, and syrups. Meringues and gelatins. Pure sugar candy, such as hard candy, jelly beans, gumdrops, mints, marshmallows, and small amounts of dark chocolate. [employer]. Eat all sweets and desserts in moderation. Fats and Oils Nonhydrogenated (trans-free) margarines. Vegetable oils, including soybean, sesame, sunflower,  olive, peanut, safflower, corn, canola, and cottonseed. Salad dressings or mayonnaise made with a vegetable oil. Limit added fats and oils that you use for cooking, baking, salads, and as spreads. Other Cocoa powder. Coffee and tea. All seasonings and condiments. The items listed above may not be a complete list of recommended foods or beverages. Contact your dietitian for more options. What foods are not recommended? Grains Breads that are made with saturated or trans fats, oils, or whole milk. Croissants. Butter rolls. Cheese breads. Sweet rolls. Donuts. Buttered popcorn. Chow mein noodles. High-fat crackers, such as cheese or butter crackers. Meats and Other Protein Sources Fatty meats, such as hotdogs, short ribs, sausage, spareribs, bacon, rib eye roast or steak, and mutton. High-fat deli meats, such as salami and bologna. Caviar. Domestic duck and goose. Organ meats, such as kidney, liver, sweetbreads, and heart. Dairy Cream, sour cream, cream cheese, and creamed cottage cheese. Whole-milk cheeses, including blue (bleu), [address], [place], [place], [address], [place], [address], cheddar, [place], and [place]. Whole or 2% milk that is liquid, evaporated, or condensed. Whole buttermilk. Cream sauce or high-fat cheese sauce. Yogurt that is made from whole milk. Beverages Regular sodas and juice drinks with added sugar. Sweets and Desserts Frosting. Pudding. Cookies. Cakes other than angel food cake. Candy that has milk chocolate or white chocolate, hydrogenated fat, butter, coconut, or unknown ingredients. Buttered syrups. Full-fat ice cream or ice cream drinks. Fats and Oils Gravy that has suet, meat fat, or shortening. Cocoa butter, hydrogenated oils, palm oil, coconut oil, palm kernel oil. These can often be found in baked products, candy, fried foods, nondairy creamers, and whipped toppings. Solid fats and shortenings, including bacon fat, salt pork, lard, and butter. Nondairy cream substitutes,  such as coffee creamers and sour cream substitutes. Salad dressings that are made of unknown oils, cheese, or sour cream. The items listed above may not be a complete list of foods and beverages to avoid. Contact your dietitian for more information. This information is not intended to replace advice given to you by your health care provider. Make sure you discuss any questions you have with your health care provider. Document Released: 09/08/2011 Document Revised: 08/15/2015 Document Reviewed: 08/31/2013 Elsevier Interactive Patient Education  2018 [employer].   DASH Eating Plan DASH stands for "Dietary Approaches to Stop Hypertension." The DASH eating plan is a healthy eating plan that has been shown to reduce high blood pressure (hypertension). It may also reduce your risk for type 2 diabetes, heart disease, and stroke. The DASH eating plan may also help with weight loss. What are tips for following this plan? General guidelines  Avoid eating more than  2,300 mg (milligrams) of salt (sodium) a day. If you have hypertension, you may need to reduce your sodium intake to 1,500 mg a day.  Limit alcohol intake to no more than 1 drink a day for nonpregnant women and 2 drinks a day for men. One drink equals 12 oz of beer, 5 oz of wine, or 1 oz of hard liquor.  Work with your health care provider to maintain a healthy body weight or to lose weight. Ask what an ideal weight is for you.  Get at least 30 minutes of exercise that causes your heart to beat faster (aerobic exercise) most days of the week. Activities may include walking, swimming, or biking.  Work with your health care provider or diet and nutrition specialist (dietitian) to adjust your eating plan to your individual calorie needs. Reading food labels  Check food labels for the amount of sodium per serving. Choose foods with less than 5 percent of the Daily Value of sodium. Generally, foods with less than 300 mg of sodium per serving  fit into this eating plan.  To find whole grains, look for the word "whole" as the first word in the ingredient list. Shopping  Buy products labeled as "low-sodium" or "no salt added."  Buy fresh foods. Avoid canned foods and premade or frozen meals. Cooking  Avoid adding salt when cooking. Use salt-free seasonings or herbs instead of table salt or sea salt. Check with your health care provider or pharmacist before using salt substitutes.  Do not fry foods. Cook foods using healthy methods such as baking, boiling, grilling, and broiling instead.  Cook with heart-healthy oils, such as olive, canola, soybean, or sunflower oil. Meal planning   Eat a balanced diet that includes: ? 5 or more servings of fruits and vegetables each day. At each meal, try to fill half of your plate with fruits and vegetables. ? Up to 6-8 servings of whole grains each day. ? Less than 6 oz of lean meat, poultry, or fish each day. A 3-oz serving of meat is about the same size as a deck of cards. One egg equals 1 oz. ? 2 servings of low-fat dairy each day. ? A serving of nuts, seeds, or beans 5 times each week. ? Heart-healthy fats. Healthy fats called Omega-3 fatty acids are found in foods such as flaxseeds and coldwater fish, like sardines, salmon, and mackerel.  Limit how much you eat of the following: ? Canned or prepackaged foods. ? Food that is high in trans fat, such as fried foods. ? Food that is high in saturated fat, such as fatty meat. ? Sweets, desserts, sugary drinks, and other foods with added sugar. ? Full-fat dairy products.  Do not salt foods before eating.  Try to eat at least 2 vegetarian meals each week.  Eat more home-cooked food and less restaurant, buffet, and fast food.  When eating at a restaurant, ask that your food be prepared with less salt or no salt, if possible. What foods are recommended? The items listed may not be a complete list. Talk with your dietitian about what  dietary choices are best for you. Grains Whole-grain or whole-wheat bread. Whole-grain or whole-wheat pasta. Brown rice. [place] Cobbatmeal. Quinoa. Bulgur. Whole-grain and low-sodium cereals. Pita bread. Low-fat, low-sodium crackers. Whole-wheat flour tortillas. Vegetables Fresh or frozen vegetables (raw, steamed, roasted, or grilled). Low-sodium or reduced-sodium tomato and vegetable juice. Low-sodium or reduced-sodium tomato sauce and tomato paste. Low-sodium or reduced-sodium canned vegetables. Fruits All fresh, dried, or  frozen fruit. Canned fruit in natural juice (without added sugar). Meat and other protein foods Skinless chicken or [place]. Ground chicken or [place]. Pork with fat trimmed off. Fish and seafood. Egg whites. Dried beans, peas, or lentils. Unsalted nuts, nut butters, and seeds. Unsalted canned beans. Lean cuts of beef with fat trimmed off. Low-sodium, lean deli meat. Dairy Low-fat (1%) or fat-free (skim) milk. Fat-free, low-fat, or reduced-fat cheeses. Nonfat, low-sodium ricotta or cottage cheese. Low-fat or nonfat yogurt. Low-fat, low-sodium cheese. Fats and oils Soft margarine without trans fats. Vegetable oil. Low-fat, reduced-fat, or light mayonnaise and salad dressings (reduced-sodium). Canola, safflower, olive, soybean, and sunflower oils. Avocado. Seasoning and other foods Herbs. Spices. Seasoning mixes without salt. Unsalted popcorn and pretzels. Fat-free sweets. What foods are not recommended? The items listed may not be a complete list. Talk with your dietitian about what dietary choices are best for you. Grains Baked goods made with fat, such as croissants, muffins, or some breads. Dry pasta or rice meal packs. Vegetables Creamed or fried vegetables. Vegetables in a cheese sauce. Regular canned vegetables (not low-sodium or reduced-sodium). Regular canned tomato sauce and paste (not low-sodium or reduced-sodium). Regular tomato and vegetable juice (not low-sodium or  reduced-sodium). Rosita Fire. Olives. Fruits Canned fruit in a light or heavy syrup. Fried fruit. Fruit in cream or butter sauce. Meat and other protein foods Fatty cuts of meat. Ribs. Fried meat. Tomasa Blase. Sausage. Bologna and other processed lunch meats. Salami. Fatback. Hotdogs. Bratwurst. Salted nuts and seeds. Canned beans with added salt. Canned or smoked fish. Whole eggs or egg yolks. Chicken or [place] with skin. Dairy Whole or 2% milk, cream, and half-and-half. Whole or full-fat cream cheese. Whole-fat or sweetened yogurt. Full-fat cheese. Nondairy creamers. Whipped toppings. Processed cheese and cheese spreads. Fats and oils Butter. Stick margarine. Lard. Shortening. Ghee. Bacon fat. Tropical oils, such as coconut, palm kernel, or palm oil. Seasoning and other foods Salted popcorn and pretzels. Onion salt, garlic salt, seasoned salt, table salt, and sea salt. Worcestershire sauce. Tartar sauce. Barbecue sauce. Teriyaki sauce. Soy sauce, including reduced-sodium. Steak sauce. Canned and packaged gravies. Fish sauce. Oyster sauce. Cocktail sauce. Horseradish that you find on the shelf. Ketchup. Mustard. Meat flavorings and tenderizers. Bouillon cubes. Hot sauce and Tabasco sauce. Premade or packaged marinades. Premade or packaged taco seasonings. Relishes. Regular salad dressings. Where to find more information:  National Heart, Lung, and Blood Institute: PopSteam.is  American Heart Association: www.heart.org Summary  The DASH eating plan is a healthy eating plan that has been shown to reduce high blood pressure (hypertension). It may also reduce your risk for type 2 diabetes, heart disease, and stroke.  With the DASH eating plan, you should limit salt (sodium) intake to 2,300 mg a day. If you have hypertension, you may need to reduce your sodium intake to 1,500 mg a day.  When on the DASH eating plan, aim to eat more fresh fruits and vegetables, whole grains, lean proteins, low-fat  dairy, and heart-healthy fats.  Work with your health care provider or diet and nutrition specialist (dietitian) to adjust your eating plan to your individual calorie needs. This information is not intended to replace advice given to you by your health care provider. Make sure you discuss any questions you have with your health care provider. Document Released: 02/26/2011 Document Revised: 03/02/2016 Document Reviewed: 03/02/2016 Elsevier Interactive Patient Education  [employer].

## 2017-04-15 LAB — LIPID PANEL
CHOLESTEROL TOTAL: 236 mg/dL — AB (ref 100–199)
Chol/HDL Ratio: 5.6 ratio — ABNORMAL HIGH (ref 0.0–5.0)
HDL: 42 mg/dL (ref >39)
LDL Calculated: 171 mg/dL — ABNORMAL HIGH (ref 0–99)
TRIGLYCERIDES: 115 mg/dL (ref 0–149)
VLDL CHOLESTEROL CAL: 23 mg/dL (ref 5–40)

## 2017-04-15 LAB — BASIC METABOLIC PANEL
BUN / CREAT RATIO: 21 — AB (ref 9–20)
BUN: 20 mg/dL (ref 6–24)
CO2: 23 mmol/L (ref 20–29)
Calcium: 9.8 mg/dL (ref 8.7–10.2)
Chloride: 101 mmol/L (ref 96–106)
Creatinine, Ser: 0.94 mg/dL (ref 0.76–1.27)
GFR calc Af Amer: 116 mL/min/{1.73_m2} (ref >59)
GFR calc non Af Amer: 100 mL/min/{1.73_m2} (ref >59)
GLUCOSE: 97 mg/dL (ref 65–99)
POTASSIUM: 4.9 mmol/L (ref 3.5–5.2)
SODIUM: 139 mmol/L (ref 134–144)

## 2017-04-15 LAB — HEPATIC FUNCTION PANEL
ALK PHOS: 93 IU/L (ref 39–117)
ALT: 31 IU/L (ref 0–44)
AST: 20 IU/L (ref 0–40)
Albumin: 4.6 g/dL (ref 3.5–5.5)
BILIRUBIN, DIRECT: 0.1 mg/dL (ref 0.00–0.40)
Bilirubin Total: 0.5 mg/dL (ref 0.0–1.2)
Total Protein: 7.2 g/dL (ref 6.0–8.5)

## 2017-04-15 LAB — TSH: TSH: 0.512 u[IU]/mL (ref 0.450–4.500)

## 2017-04-15 LAB — HEMOGLOBIN A1C
Est. average glucose Bld gHb Est-mCnc: 123 mg/dL
Hgb A1c MFr Bld: 5.9 % — ABNORMAL HIGH (ref 4.8–5.6)

## 2017-05-19 ENCOUNTER — Ambulatory Visit (HOSPITAL_COMMUNITY): Admission: RE | Admit: 2017-05-19 | Payer: BLUE CROSS/BLUE SHIELD | Source: Ambulatory Visit

## 2017-05-19 ENCOUNTER — Ambulatory Visit (HOSPITAL_COMMUNITY)
Admission: RE | Admit: 2017-05-19 | Discharge: 2017-05-19 | Disposition: A | Discharge status: Home / Self Care | Payer: BLUE CROSS/BLUE SHIELD | Source: Ambulatory Visit | Attending: Adult Health | Admitting: Adult Health

## 2017-05-19 DIAGNOSIS — I288 Other diseases of pulmonary vessels: Secondary | ICD-10-CM | POA: Diagnosis not present

## 2017-05-19 DIAGNOSIS — R079 Chest pain, unspecified: Secondary | ICD-10-CM

## 2017-05-19 MED ORDER — METOPROLOL TARTRATE 5 MG/5ML IV SOLN
5.0000 mg | INTRAVENOUS | Status: DC | PRN
  Administered 2017-05-19 (×4): 5 mg via INTRAVENOUS
  Filled 2017-05-19: qty 5

## 2017-05-19 MED ORDER — NITROGLYCERIN 0.4 MG SL SUBL
0.8000 mg | SUBLINGUAL_TABLET | Freq: Once | SUBLINGUAL | Status: AC
  Administered 2017-05-19: 0.8 mg via SUBLINGUAL
  Filled 2017-05-19: qty 25

## 2017-05-19 MED ORDER — METOPROLOL TARTRATE 5 MG/5ML IV SOLN
INTRAVENOUS | Status: AC
  Administered 2017-05-19: 5 mg via INTRAVENOUS
  Filled 2017-05-19: qty 20

## 2017-05-19 MED ORDER — IOPAMIDOL (ISOVUE-370) INJECTION 76%
INTRAVENOUS | Status: AC
  Administered 2017-05-19: 80 mL
  Filled 2017-05-19: qty 100

## 2017-05-19 MED ORDER — NITROGLYCERIN 0.4 MG SL SUBL
SUBLINGUAL_TABLET | SUBLINGUAL | Status: AC
  Filled 2017-05-19: qty 2

## 2017-05-19 MED ORDER — DILTIAZEM HCL 25 MG/5ML IV SOLN
5.0000 mg | Freq: Once | INTRAVENOUS | Status: AC
  Administered 2017-05-19: 5 mg via INTRAVENOUS
  Filled 2017-05-19: qty 5

## 2017-05-21 DIAGNOSIS — Z3189 Encounter for other procreative management: Secondary | ICD-10-CM | POA: Diagnosis not present

## 2017-06-08 NOTE — Progress Notes (Signed)
Cardiology Office Note   Date:  06/09/2017   ID:  Victor David, DOB 02-02-1976, MRN [id number]  PCP:  Burton Apley, MD  Cardiologist: Dr. Herbie Baltimore Chief Complaint  Patient presents with  . Follow-up  . Chest Pain     History of Present Illness: Victor David is a 42 y.o. male who presents for ongoing assessment and management of chest discomfort, with history of Barrett's esophagus and is on PPI followed by GI.  The patient was last seen in the office on 04/14/2017 as a new patient and assigned to Dr. Herbie Baltimore.  The patient was scheduled for cardiac CT with FFR due to multiple cardiovascular risk factors to include family history of premature coronary artery disease, hypercholesterolemia, moderate hypertension, obesity, and sedentary lifestyle.  Cardiac CT dated 05/19/2017 was a coronary calcium score of 0, normal coronary origin and left dominance, no evidence of CAD, elevated pulmonary pressures suggestive of pulmonary hypertension.   He comes today feeling well, still having GERD symptoms.  Has some coughing associated with this.  Past Medical History:  Diagnosis Date  . Barrett's esophagus     No past surgical history on file.   Current Outpatient Medications  Medication Sig Dispense Refill  . aspirin EC 81 MG tablet Take 1 tablet (81 mg total) by mouth daily. 90 tablet 3  . atorvastatin (LIPITOR) 40 MG tablet Take 1 tablet (40 mg total) by mouth daily. 90 tablet 3  . esomeprazole (NEXIUM) 20 MG packet Take 20 mg by mouth daily before breakfast.    . famotidine (PEPCID AC) 10 MG chewable tablet Chew 10 mg by mouth 2 (two) times daily as needed (stomach pain).    Marland Kitchen ibuprofen (ADVIL,MOTRIN) 200 MG tablet Take 400 mg by mouth every 6 (six) hours as needed for headache, mild pain or moderate pain.    . metoprolol tartrate (LOPRESSOR) 50 MG tablet Take 1 tablet (50 mg total) by mouth once for 1 dose. TAKE 1 HOUR BEFORE CT PROCEDURE 1 tablet 0   No current facility-administered  medications for this visit.     Allergies:   Patient has no known allergies.    Social History:  The patient  reports that  has never smoked. he has never used smokeless tobacco. He reports that he does not drink alcohol or use drugs.   Family History:  The patient's family history includes Heart attack in his father and paternal grandfather; High Cholesterol in his brother and brother.    ROS: All other systems are reviewed and negative. Unless otherwise mentioned in H&P    PHYSICAL EXAM: VS:  Ht 6' (1.829 m)   BMI 38.03 kg/m  , BMI Body mass index is 38.03 kg/m. GEN: Well nourished, well developed, in no acute distress  HEENT: normal  Cardiac: RRR; no murmurs, rubs, or gallops,no edema  Respiratory:  clear to auscultation bilaterally, normal work of breathing GI: soft, nontender, nondistended, + BS Psych: euthymic mood, full affect    Recent Labs: 04/11/2017: Hemoglobin 14.7; Platelets 330 04/14/2017: ALT 31; BUN 20; Creatinine, Ser 0.94; Magnesium 2.5; Potassium 4.9; Sodium 139; TSH 0.512    Lipid Panel    Component Value Date/Time   CHOL 236 (H) 04/14/2017 0950   TRIG 115 04/14/2017 0950   HDL 42 04/14/2017 0950   CHOLHDL 5.6 (H) 04/14/2017 0950   LDLCALC 171 (H) 04/14/2017 0950      Wt Readings from Last 3 Encounters:  04/14/17 280 lb 6.4 oz (127.2 kg)  04/10/17 285 lb 4.8  oz (129.4 kg)  02/09/16 263 lb 14.3 oz (119.7 kg)      ASSESSMENT AND PLAN:  cardiac CT calcium score of 0.  No changes in his medication regimen.  2.  GERD: the patient is advised to follow-up with GI for further assessment and evaluation.  Cardiology will not be providing reflux medication.  3. Mild Pulmonary HTN: Followed by primary.  Further testing such as a sleep apnea evaluation may be considered.     Current medicines are reviewed at length with the patient today.    Labs/ tests ordered today include: none   Bettey MareKathryn M. Liborio NixonLawrence DNP, ANP, AACC   06/09/2017 8:55 AM      [facility] [address], [place], [place] [address] Phone: [phone]; Fax: [phone]

## 2017-06-09 ENCOUNTER — Encounter: Payer: Self-pay | Admitting: Adult Health

## 2017-06-09 ENCOUNTER — Ambulatory Visit (INDEPENDENT_AMBULATORY_CARE_PROVIDER_SITE_OTHER): Payer: BLUE CROSS/BLUE SHIELD | Admitting: Adult Health

## 2017-06-09 ENCOUNTER — Telehealth: Payer: Self-pay

## 2017-06-09 VITALS — BP 120/86 | HR 86 | Ht 72.0 in | Wt 280.4 lb

## 2017-06-09 DIAGNOSIS — R0789 Other chest pain: Secondary | ICD-10-CM

## 2017-06-09 DIAGNOSIS — I272 Pulmonary hypertension, unspecified: Secondary | ICD-10-CM

## 2017-06-09 DIAGNOSIS — K219 Gastro-esophageal reflux disease without esophagitis: Secondary | ICD-10-CM | POA: Diagnosis not present

## 2017-06-09 NOTE — Telephone Encounter (Signed)
Not needed

## 2017-06-09 NOTE — Patient Instructions (Signed)
Medication Instructions:  NO CHANGES- Your physician recommends that you continue on your current medications as directed. Please refer to the Current Medication list given to you today.  If you need a refill on your cardiac medications before your next appointment, please call your pharmacy.  Follow-Up: Your physician wants you to follow-up in: PRN-OR 5 YEARS [facility]. MAKE SURE TO CALL IF YOU HAVE ANY PROBLEMS [phone].  [facility] you [facility]!!

## 2017-06-17 DIAGNOSIS — Z3189 Encounter for other procreative management: Secondary | ICD-10-CM | POA: Diagnosis not present

## 2017-07-06 DIAGNOSIS — K2270 Barrett's esophagus without dysplasia: Secondary | ICD-10-CM | POA: Diagnosis not present

## 2017-07-12 DIAGNOSIS — K449 Diaphragmatic hernia without obstruction or gangrene: Secondary | ICD-10-CM | POA: Diagnosis not present

## 2017-07-12 DIAGNOSIS — K2930 Chronic superficial gastritis without bleeding: Secondary | ICD-10-CM | POA: Diagnosis not present

## 2017-07-12 DIAGNOSIS — K3189 Other diseases of stomach and duodenum: Secondary | ICD-10-CM | POA: Diagnosis not present

## 2017-07-12 DIAGNOSIS — K2270 Barrett's esophagus without dysplasia: Secondary | ICD-10-CM | POA: Diagnosis not present

## 2017-07-15 DIAGNOSIS — K2930 Chronic superficial gastritis without bleeding: Secondary | ICD-10-CM | POA: Diagnosis not present

## 2017-07-15 DIAGNOSIS — K2270 Barrett's esophagus without dysplasia: Secondary | ICD-10-CM | POA: Diagnosis not present

## 2017-08-03 ENCOUNTER — Ambulatory Visit: Payer: Self-pay | Admitting: Surgery

## 2017-08-03 DIAGNOSIS — E669 Obesity, unspecified: Secondary | ICD-10-CM | POA: Diagnosis not present

## 2017-08-03 DIAGNOSIS — Z8619 Personal history of other infectious and parasitic diseases: Secondary | ICD-10-CM | POA: Insufficient documentation

## 2017-08-03 DIAGNOSIS — K449 Diaphragmatic hernia without obstruction or gangrene: Secondary | ICD-10-CM

## 2017-08-03 DIAGNOSIS — K2270 Barrett's esophagus without dysplasia: Secondary | ICD-10-CM | POA: Insufficient documentation

## 2017-08-03 DIAGNOSIS — E7800 Pure hypercholesterolemia, unspecified: Secondary | ICD-10-CM | POA: Insufficient documentation

## 2017-08-03 NOTE — H&P (Signed)
Victor David Documented: 08/03/2017 8:36 AM Location: [facility] Patient #: [id number] DOB: [birth year] Married / Language: [place] / Race: White Male  History of Present Illness Ardeth Sportsman MD; 08/03/2017 9:34 AM) The patient is a 42 year old male who presents with a hiatal hernia. Note for "Hiatal hernia": ` ` ` Patient sent for surgical consultation at the request of Dr. Evette Cristal  Chief Complaint: However hernia with persistent reflux, Barrett's, etc.  The patient is a pleasant obese male that has had heartburn and reflux issues for over a decade. He used to be able control Tums. However it had worsening symptoms. Diagnosed with Barrett's esophagus about 7 years ago. He's been taking Nexium symptoms. However he still gets reflux issues. Pepcid was added at night. He cannot lie down flat. He often gets nighttime emesis. He had an episode of chest pain that concerned him. [facility] and saw cardiology. While there is a family history of early cardiac death in his family, his workup was completely negative. He is avoid its tobacco for 10 years. However he has gained weight since he quit smoking. He is tried to get back to the gym and intentionally lose some weight. He usually moves his bowels about once or twice a day. He can walk several miles without difficulty. He denies any dysphagia liquids. Occasionally solids will sticker yesterday more slowly. When he bends over he definitely gets heartburn and reflux. Usually he can finish his meal okay. Some occasional bloating. He's never had any abdominal surgery. He saw gastroenterology. Endoscopy confirmed persistent Barrett's esophagus. Endoscopy and CT scan also noted a hiatal hernia. Given him having symptoms despite being on 2 medications and having Helicobacter pylori treatment done with still persistent symptoms, surgical consultation requested.  (Review of systems as stated in this history (HPI)  or in the review of systems. Otherwise all other 12 point ROS are negative) ` ` `   Past Surgical History (Tanisha A. Manson Passey, RMA; 08/03/2017 8:36 AM) Knee Surgery Right. Oral Surgery  Diagnostic Studies History (Tanisha A. Manson Passey, RMA; 08/03/2017 8:36 AM) Colonoscopy never  Allergies (Tanisha A. Manson Passey, RMA; 08/03/2017 8:37 AM) No Known Drug Allergies [08/03/2017]: Allergies Reconciled  Medication History (Tanisha A. Manson Passey, RMA; 08/03/2017 8:38 AM) Atorvastatin Calcium (  Tablet, Oral) Active. NexIUM (  Packet, Oral) Active. Aspirin (  Tablet, Oral) Active. Medications Reconciled  Social History (Tanisha A. Manson Passey, RMA; 08/03/2017 8:36 AM) Alcohol use Occasional alcohol use. Caffeine use Carbonated beverages, Coffee, Tea. No drug use Tobacco use Former smoker.  Family History (Tanisha A. Manson Passey, RMA; 08/03/2017 8:36 AM) Alcohol Abuse Mother. Arthritis Father. Depression Father, Mother. Heart Disease Father. Heart disease in male family member before age 47  Other Problems (Tanisha A. Manson Passey, RMA; 08/03/2017 8:36 AM) Gastroesophageal Reflux Disease Hypercholesterolemia     Review of Systems (Tanisha A. Brown RMA; 08/03/2017 8:36 AM) General Not Present- Appetite Loss, Chills, Fatigue, Fever, Night Sweats, Weight Gain and Weight Loss. Skin Not Present- Change in Wart/Mole, Dryness, Hives, Jaundice, New Lesions, Non-Healing Wounds, Rash and Ulcer. HEENT Not Present- Earache, Hearing Loss, Hoarseness, Nose Bleed, Oral Ulcers, Ringing in the Ears, Seasonal Allergies, Sinus Pain, Sore Throat, Visual Disturbances, Wears glasses/contact lenses and Yellow Eyes. Respiratory Present- Snoring. Not Present- Bloody sputum, Chronic Cough, Difficulty Breathing and Wheezing. Breast Not Present- Breast Mass, Breast Pain, Nipple Discharge and Skin Changes. Cardiovascular Not Present- Chest Pain, Difficulty Breathing Lying Down, Leg Cramps, Palpitations, Rapid Heart  Rate, Shortness of Breath and Swelling of Extremities. Gastrointestinal  Present- Indigestion. Not Present- Abdominal Pain, Bloating, Bloody Stool, Change in Bowel Habits, Chronic diarrhea, Constipation, Difficulty Swallowing, Excessive gas, Gets full quickly at meals, Hemorrhoids, Nausea, Rectal Pain and Vomiting. Male Genitourinary Not Present- Blood in Urine, Change in Urinary Stream, Frequency, Impotence, Nocturia, Painful Urination, Urgency and Urine Leakage. Musculoskeletal Not Present- Back Pain, Joint Pain, Joint Stiffness, Muscle Pain, Muscle Weakness and Swelling of Extremities. Neurological Not Present- Decreased Memory, Fainting, Headaches, Numbness, Seizures, Tingling, Tremor, Trouble walking and Weakness. Psychiatric Not Present- Anxiety, Bipolar, Change in Sleep Pattern, Depression, Fearful and Frequent crying. Endocrine Not Present- Cold Intolerance, Excessive Hunger, Hair Changes, Heat Intolerance, Hot flashes and New Diabetes. Hematology Not Present- Blood Thinners, Easy Bruising, Excessive bleeding, Gland problems, HIV and Persistent Infections.  Vitals (Tanisha A. Brown RMA; 08/03/2017 8:37 AM) 08/03/2017 8:36 AM Weight: 281 lb Height: 72in Body Surface Area: 2.46 m Body Mass Index: 38.11 kg/m  Temp.: 98.51F  Pulse: 106 (Regular)  BP: 138/94 (Sitting, Left Arm, Standard)      Physical Exam Ardeth Sportsman MD; 08/03/2017 9:31 AM)  General Mental Status-Alert. General Appearance-Not in acute distress, Not Sickly. Orientation-Oriented X3. Hydration-Well hydrated. Voice-Normal.  Integumentary Global Assessment Upon inspection and palpation of skin surfaces of the - Axillae: non-tender, no inflammation or ulceration, no drainage. and Distribution of scalp and body hair is normal. General Characteristics Temperature - normal warmth is noted.  Head and Neck Head-normocephalic, atraumatic with no lesions or palpable masses. Face Global  Assessment - atraumatic, no absence of expression. Neck Global Assessment - no abnormal movements, no bruit auscultated on the right, no bruit auscultated on the left, no decreased range of motion, non-tender. Trachea-midline. Thyroid Gland Characteristics - non-tender.  Eye Eyeball - Left-Extraocular movements intact, No Nystagmus. Eyeball - Right-Extraocular movements intact, No Nystagmus. Cornea - Left-No Hazy. Cornea - Right-No Hazy. Sclera/Conjunctiva - Left-No scleral icterus, No Discharge. Sclera/Conjunctiva - Right-No scleral icterus, No Discharge. Pupil - Left-Direct reaction to light normal. Pupil - Right-Direct reaction to light normal.  ENMT Ears Pinna - Left - no drainage observed, no generalized tenderness observed. Right - no drainage observed, no generalized tenderness observed. Nose and Sinuses External Inspection of the Nose - no destructive lesion observed. Inspection of the nares - Left - quiet respiration. Right - quiet respiration. Mouth and Throat Lips - Upper Lip - no fissures observed, no pallor noted. Lower Lip - no fissures observed, no pallor noted. Nasopharynx - no discharge present. Oral Cavity/Oropharynx - Tongue - no dryness observed. Oral Mucosa - no cyanosis observed. Hypopharynx - no evidence of airway distress observed.  Chest and Lung Exam Inspection Movements - Normal and Symmetrical. Accessory muscles - No use of accessory muscles in breathing. Palpation Palpation of the chest reveals - Non-tender. Auscultation Breath sounds - Normal and Clear.  Cardiovascular Auscultation Rhythm - Regular. Murmurs & Other Heart Sounds - Auscultation of the heart reveals - No Murmurs and No Systolic Clicks.  Abdomen Inspection Inspection of the abdomen reveals - No Visible peristalsis and No Abnormal pulsations. Umbilicus - No Bleeding, No Urine drainage. Palpation/Percussion Palpation and Percussion of the abdomen reveal - Soft, Non  Tender, No Rebound tenderness, No Rigidity (guarding) and No Cutaneous hyperesthesia. Note: Abdomen obese but soft. Nontender. No diastases. Not distended. No umbilical or incisional hernias. No guarding.  Male Genitourinary Sexual Maturity Tanner 5 - Adult hair pattern and Adult penile size and shape.  Peripheral Vascular Upper Extremity Inspection - Left - No Cyanotic nailbeds, Not Ischemic. Right - No Cyanotic  nailbeds, Not Ischemic.  Neurologic Neurologic evaluation reveals -normal attention span and ability to concentrate, able to name objects and repeat phrases. Appropriate fund of knowledge , normal sensation and normal coordination. Mental Status Affect - not angry, not paranoid. Cranial Nerves-Normal Bilaterally. Gait-Normal.  Neuropsychiatric Mental status exam performed with findings of-able to articulate well with normal speech/language, rate, volume and coherence, thought content normal with ability to perform basic computations and apply abstract reasoning and no evidence of hallucinations, delusions, obsessions or homicidal/suicidal ideation.  Musculoskeletal Global Assessment Spine, Ribs and Pelvis - no instability, subluxation or laxity. Right Upper Extremity - no instability, subluxation or laxity.  Lymphatic Head & Neck  General Head & Neck Lymphatics: Bilateral - Description - No Localized lymphadenopathy. Axillary  General Axillary Region: Bilateral - Description - No Localized lymphadenopathy. Femoral & Inguinal  Generalized Femoral & Inguinal Lymphatics: Left - Description - No Localized lymphadenopathy. Right - Description - No Localized lymphadenopathy.    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 Refractory to Tums. Partially manageable with Nexium and Pepcid but still with nighttime reflux. Barrett's  esophagus. Intermittent dysphagia to solids. He Helicobacter pylori infection status post treatment with persistent symptoms.  I think he is exhausting nonoperative management at this time.  Given his obesity, long-term results often can be better with gastric bypass with hiatal hernia repair. He is working to intentionally lose weight and avoid needing bariatric surgery. He would like to hold off on considering that.  He is reasonable candidate for hiatal hernia repair and fundoplication. It does not seem like a giant defect. Probably would consider absorbable mesh phasic some reinforcement to minimize recurrence. In proceeding.  Would like to get esophageal manometry to get a sense of his motility. Would like to do a good fundoplication on him to avoid recurrent symptoms that one to make sure he does not have any significant dysmotility.  Given his occasional nighttime emesis and bloating, would like to double check with a gastric emptying study to make sure there is no problems there. If very severe, he may benefit from concurrent pyloroplasty. Given the absence of diabetes or narcotics or other neurologic issues, hopefully not too likely. However, I wish to be thorough in the workup  Current Plans You are being scheduled for surgery- Our schedulers will call you.  You should hear from our office's scheduling department within 5 working days about the location, date, and time of surgery. We try to make accommodations for patient's preferences in scheduling surgery, but sometimes the OR schedule or the surgeon's schedule prevents [place] from making those accommodations.  If you have not heard from our office [phone] in 5 working days, call the office and ask for your surgeon's nurse.  If you have other questions about your diagnosis, plan, or surgery, call the office and ask for your surgeon's nurse.  Follow Up - Call [facility] office after tests / studies doneto discuss further plans Pt  Education - [facility] Esophageal Surgery Diet HCI (Jolan Upchurch): discussed with patient and provided information. Pt Education - [facility] Laparoscopic Surgery HCI The anatomy & physiology of the foregut and anti-reflux mechanism was discussed. The pathophysiology of hiatal herniation and GERD was discussed. Natural history risks without surgery was discussed. The patient's symptoms are not adequately controlled by medicines and other non-operative treatments. I feel the risks of no intervention will lead to serious problems that outweigh the operative risks;  therefore, I recommended surgery to reduce the hiatal hernia out of the chest and fundoplication to rebuild the anti-reflux valve and control reflux better. Need for a thorough workup to rule out the differential diagnosis and plan treatment was explained. I explained laparoscopic techniques with possible need for an open approach.  Risks such as bleeding, infection, abscess, leak, need for further treatment, heart attack, death, and other risks were discussed. I noted a good likelihood this will help address the problem. Goals of post-operative recovery were discussed as well. Possibility that this will not correct all symptoms was explained. Post-operative dysphagia, need for short-term liquid & pureed diet, inability to vomit, possibility of reherniation, possible need for medicines to help control symptoms in addition to surgery were discussed. We will work to minimize complications. Educational handouts further explaining the pathology, treatment options, and dysphagia diet was given as well. Questions were answered. The patient expresses understanding & wishes to proceed with surgery.   HISTORY OF HELICOBACTER PYLORI INFECTION (Z86.19) Impression: Biopsy and treated by gastroenterology.   BARRETT'S ESOPHAGUS DETERMINED BY ENDOSCOPY (K22.70) Impression: Sometimes parents can regress if it is short segment after fundoplication. Most likely will  need some endoscopic surveillance in the future per his gastroenterologist.   OBESITY (BMI 30-39.9) (E66.9) Impression: I did discuss given this fact he has hypertension and BMI of 35, he can be a candidate for weight loss surgery. He is intentionally trying to lose weight and wishes to avoid any bariatric surgery at this time. He would rather proceed with fundoplication and hiatal hernia repair  Ardeth Sportsman, M.D., F.A.C.S. Gastrointestinal and Minimally Invasive Surgery [facility] [address], [address] [place], [place] [address] [phone] Main / Paging

## 2017-08-06 ENCOUNTER — Other Ambulatory Visit (HOSPITAL_COMMUNITY): Payer: Self-pay | Admitting: Surgery

## 2017-08-06 DIAGNOSIS — R140 Abdominal distension (gaseous): Secondary | ICD-10-CM

## 2017-08-18 ENCOUNTER — Other Ambulatory Visit: Payer: Self-pay | Admitting: Gastroenterology

## 2017-08-24 ENCOUNTER — Encounter (HOSPITAL_COMMUNITY)
Admission: RE | Admit: 2017-08-24 | Discharge: 2017-08-24 | Disposition: A | Discharge status: Home / Self Care | Payer: BLUE CROSS/BLUE SHIELD | Source: Ambulatory Visit | Attending: Surgery | Admitting: Surgery

## 2017-08-24 DIAGNOSIS — K2270 Barrett's esophagus without dysplasia: Secondary | ICD-10-CM | POA: Diagnosis not present

## 2017-08-24 DIAGNOSIS — R140 Abdominal distension (gaseous): Secondary | ICD-10-CM | POA: Diagnosis not present

## 2017-08-24 MED ORDER — TECHNETIUM TC 99M SULFUR COLLOID
2.2000 | Freq: Once | INTRAVENOUS | Status: AC | PRN
  Administered 2017-08-24: 2.2 via INTRAVENOUS

## 2017-08-27 NOTE — Progress Notes (Signed)
Attempted to call patient and left message on patient's voicemail to confirm appointment.  Encouraged patient to call endoscopy department for any questions.

## 2017-08-29 MED ORDER — METRONIDAZOLE IN NACL 5-0.79 MG/ML-% IV SOLN
500.0000 mg | INTRAVENOUS | Status: DC
  Filled 2017-08-29: qty 100

## 2017-08-30 ENCOUNTER — Encounter (HOSPITAL_COMMUNITY)
Admission: RE | Disposition: A | Discharge status: Home / Self Care | Payer: Self-pay | Source: Ambulatory Visit | Attending: Gastroenterology

## 2017-08-30 ENCOUNTER — Ambulatory Visit (HOSPITAL_COMMUNITY)
Admission: RE | Admit: 2017-08-30 | Discharge: 2017-08-30 | Disposition: A | Discharge status: Home / Self Care | Payer: BLUE CROSS/BLUE SHIELD | Source: Ambulatory Visit | Attending: Gastroenterology | Admitting: Gastroenterology

## 2017-08-30 DIAGNOSIS — K449 Diaphragmatic hernia without obstruction or gangrene: Secondary | ICD-10-CM | POA: Insufficient documentation

## 2017-08-30 HISTORY — PX: ESOPHAGEAL MANOMETRY: SHX5429

## 2017-08-30 SURGERY — MANOMETRY, ESOPHAGUS

## 2017-08-30 MED ORDER — LIDOCAINE VISCOUS HCL 2 % MT SOLN
OROMUCOSAL | Status: AC
  Filled 2017-08-30: qty 15

## 2017-08-30 SURGICAL SUPPLY — 2 items
FACESHIELD LNG OPTICON STERILE (SAFETY) IMPLANT
GLOVE BIO SURGEON STRL SZ8 (GLOVE) ×4 IMPLANT

## 2017-08-30 NOTE — Progress Notes (Signed)
Esophageal manometry performed per protocol.  Patient tolerated procedure without complications.  Results to be interpreted by Dr. Schooler. 

## 2017-08-31 ENCOUNTER — Encounter (HOSPITAL_COMMUNITY): Payer: Self-pay | Admitting: Gastroenterology

## 2017-08-31 DIAGNOSIS — Z113 Encounter for screening for infections with a predominantly sexual mode of transmission: Secondary | ICD-10-CM | POA: Diagnosis not present

## 2017-09-15 DIAGNOSIS — K219 Gastro-esophageal reflux disease without esophagitis: Secondary | ICD-10-CM | POA: Diagnosis not present

## 2017-10-07 NOTE — Progress Notes (Signed)
EKG 04-14-17 Epic   CT CORONARY 05-19-17 Epic   CXR 04-11-17 Epic

## 2017-10-08 ENCOUNTER — Other Ambulatory Visit: Payer: Self-pay

## 2017-10-08 ENCOUNTER — Encounter (HOSPITAL_COMMUNITY)
Admission: RE | Admit: 2017-10-08 | Discharge: 2017-10-08 | Disposition: A | Discharge status: Home / Self Care | Payer: BLUE CROSS/BLUE SHIELD | Source: Ambulatory Visit | Attending: Surgery | Admitting: Surgery

## 2017-10-08 ENCOUNTER — Encounter (HOSPITAL_COMMUNITY): Payer: Self-pay

## 2017-10-08 DIAGNOSIS — Z01812 Encounter for preprocedural laboratory examination: Secondary | ICD-10-CM | POA: Diagnosis not present

## 2017-10-08 DIAGNOSIS — K2270 Barrett's esophagus without dysplasia: Secondary | ICD-10-CM | POA: Insufficient documentation

## 2017-10-08 DIAGNOSIS — K449 Diaphragmatic hernia without obstruction or gangrene: Secondary | ICD-10-CM | POA: Diagnosis not present

## 2017-10-08 HISTORY — DX: Personal history of other diseases of the digestive system: Z87.19

## 2017-10-08 HISTORY — DX: Gastro-esophageal reflux disease without esophagitis: K21.9

## 2017-10-08 HISTORY — DX: Pulmonary hypertension, unspecified: I27.20

## 2017-10-08 HISTORY — DX: Chest pain, unspecified: R07.9

## 2017-10-08 LAB — CBC
HCT: 47.2 % (ref 39.0–52.0)
HEMOGLOBIN: 16 g/dL (ref 13.0–17.0)
MCH: 31.2 pg (ref 26.0–34.0)
MCHC: 33.9 g/dL (ref 30.0–36.0)
MCV: 92 fL (ref 78.0–100.0)
PLATELETS: 369 10*3/uL (ref 150–400)
RBC: 5.13 MIL/uL (ref 4.22–5.81)
RDW: 12.9 % (ref 11.5–15.5)
WBC: 9.8 10*3/uL (ref 4.0–10.5)

## 2017-10-08 NOTE — Patient Instructions (Addendum)
Victor AbrahamsWesley David  10/08/2017   Your procedure is scheduled on: 10-14-17   Report [facility] Main  Entrance    Report to admitting at 11:00AM    Call this number if you have problems the morning of surgery [phone]     Remember: DRINK 2 PRESURGERY ENSURE DRINKS THE NIGHT BEFORE SURGERY AT  10:00 PM AND 1 PRESURGERY DRINK THE DAY OF THE PROCEDURE 3 HOURS PRIOR TO SCHEDULED SURGERY. NO SOLIDS AFTER MIDNIGHT THE DAY PRIOR TO THE SURGERY. NOTHING BY MOUTH EXCEPT CLEAR LIQUIDS UNTIL THREE HOURS PRIOR TO SCHEDULED SURGERY. PLEASE FINISH PRESURGERY ENSURE DRINK PER SURGEON ORDER 3 HOURS PRIOR TO SCHEDULED SURGERY TIME WHICH NEEDS TO BE COMPLETED AT ____10:00AM_____.        Take these medicines the morning of surgery with A SIP OF WATER:  Nexium, Pepcid if needed, nasal spray                                 You may not have any metal on your body including hair pins and              piercings  Do not wear jewelry, make-up, lotions, powders or perfumes, deodorant                        Men may shave face and neck.   Do not bring valuables to the hospital. [facility] IS NOT             RESPONSIBLE   FOR VALUABLES.  Contacts, dentures or bridgework may not be worn into surgery.      Patients discharged the day of surgery will not be allowed to drive home.  Name and phone number of your driver:  Special Instructions: N/A              Please read over the following fact sheets you were given: _____________________________________________________________________      CLEAR LIQUID DIET   Foods Allowed                                                                     Foods Excluded  Coffee and tea, regular and decaf                             liquids that you cannot  Plain Jell-O in any flavor                                             see through such as: Fruit ices (not with fruit pulp)                                     milk, soups, orange juice   Iced Popsicles  All solid food Carbonated beverages, regular and diet                                    Cranberry, grape and apple juices Sports drinks like Gatorade Lightly seasoned clear broth or consume(fat free) Sugar, honey syrup  Sample Menu Breakfast                                Lunch                                     Supper Cranberry juice                    Beef broth                            Chicken broth Jell-O                                     Grape juice                           Apple juice Coffee or tea                        Jell-O                                      Popsicle                                                Coffee or tea                        Coffee or tea  _____________________________________________________________________            [facility] - Preparing for Surgery Before surgery, you can play an important role.  Because skin is not sterile, your skin needs to be as free of germs as possible.  You can reduce the number of germs on your skin by washing with CHG (chlorahexidine gluconate) soap before surgery.  CHG is an antiseptic cleaner which kills germs and bonds with the skin to continue killing germs even after washing. Please DO NOT use if you have an allergy to CHG or antibacterial soaps.  If your skin becomes reddened/irritated stop using the CHG and inform your nurse when you arrive at Short Stay. Do not shave (including legs and underarms) for at least 48 hours prior to the first CHG shower.  You may shave your face/neck. Please follow these instructions carefully:  1.  Shower with CHG Soap the night before surgery and the  morning of Surgery.  2.  If you choose to wash your hair, wash your hair first as usual with your  normal  shampoo.  3.  After you shampoo, rinse your hair and body thoroughly to remove the  shampoo.  4.  Use CHG as you would any other liquid soap.  You  can apply chg directly  to the skin and wash                       Gently with a scrungie or clean washcloth.  5.  Apply the CHG Soap to your body ONLY FROM THE NECK DOWN.   Do not use on face/ open                           Wound or open sores. Avoid contact with eyes, ears mouth and genitals (private parts).                       Wash face,  Genitals (private parts) with your normal soap.             6.  Wash thoroughly, paying special attention to the area where your surgery  will be performed.  7.  Thoroughly rinse your body with warm water from the neck down.  8.  DO NOT shower/wash with your normal soap after using and rinsing off  the CHG Soap.                9.  Pat yourself dry with a clean towel.            10.  Wear clean pajamas.            11.  Place clean sheets on your bed the night of your first shower and do not  sleep with pets. Day of Surgery : Do not apply any lotions/deodorants the morning of surgery.  Please wear clean clothes to the hospital/surgery center.  FAILURE TO FOLLOW THESE INSTRUCTIONS MAY RESULT IN THE CANCELLATION OF YOUR SURGERY PATIENT SIGNATURE_________________________________  NURSE SIGNATURE__________________________________  ________________________________________________________________________

## 2017-10-14 ENCOUNTER — Ambulatory Visit (HOSPITAL_COMMUNITY): Payer: BLUE CROSS/BLUE SHIELD | Admitting: Certified Registered Nurse Anesthetist

## 2017-10-14 ENCOUNTER — Other Ambulatory Visit: Payer: Self-pay

## 2017-10-14 ENCOUNTER — Encounter (HOSPITAL_COMMUNITY)
Admission: RE | Disposition: A | Discharge status: Home / Self Care | Payer: Self-pay | Source: Ambulatory Visit | Attending: Surgery

## 2017-10-14 ENCOUNTER — Observation Stay (HOSPITAL_COMMUNITY)
Admission: RE | Admit: 2017-10-14 | Discharge: 2017-10-17 | Disposition: A | Discharge status: Home / Self Care | Payer: BLUE CROSS/BLUE SHIELD | Source: Ambulatory Visit | Attending: Surgery | Admitting: Surgery

## 2017-10-14 ENCOUNTER — Encounter (HOSPITAL_COMMUNITY): Payer: Self-pay | Admitting: Anesthesiology

## 2017-10-14 DIAGNOSIS — Z87891 Personal history of nicotine dependence: Secondary | ICD-10-CM | POA: Insufficient documentation

## 2017-10-14 DIAGNOSIS — K529 Noninfective gastroenteritis and colitis, unspecified: Secondary | ICD-10-CM | POA: Diagnosis not present

## 2017-10-14 DIAGNOSIS — K219 Gastro-esophageal reflux disease without esophagitis: Secondary | ICD-10-CM | POA: Diagnosis present

## 2017-10-14 DIAGNOSIS — Z6837 Body mass index (BMI) 37.0-37.9, adult: Secondary | ICD-10-CM | POA: Insufficient documentation

## 2017-10-14 DIAGNOSIS — Z79899 Other long term (current) drug therapy: Secondary | ICD-10-CM | POA: Insufficient documentation

## 2017-10-14 DIAGNOSIS — K449 Diaphragmatic hernia without obstruction or gangrene: Principal | ICD-10-CM | POA: Insufficient documentation

## 2017-10-14 DIAGNOSIS — K2100 Gastro-esophageal reflux disease with esophagitis, without bleeding: Secondary | ICD-10-CM

## 2017-10-14 DIAGNOSIS — E669 Obesity, unspecified: Secondary | ICD-10-CM | POA: Diagnosis present

## 2017-10-14 DIAGNOSIS — Z7982 Long term (current) use of aspirin: Secondary | ICD-10-CM | POA: Diagnosis not present

## 2017-10-14 DIAGNOSIS — Z9889 Other specified postprocedural states: Secondary | ICD-10-CM

## 2017-10-14 DIAGNOSIS — K2270 Barrett's esophagus without dysplasia: Secondary | ICD-10-CM | POA: Diagnosis not present

## 2017-10-14 DIAGNOSIS — E7800 Pure hypercholesterolemia, unspecified: Secondary | ICD-10-CM | POA: Diagnosis present

## 2017-10-14 HISTORY — PX: INSERTION OF MESH: SHX5868

## 2017-10-14 SURGERY — FUNDOPLICATION, NISSEN, ROBOT-ASSISTED, LAPAROSCOPIC
Anesthesia: General

## 2017-10-14 MED ORDER — OXYCODONE HCL 5 MG PO TABS
5.0000 mg | ORAL_TABLET | ORAL | Status: DC | PRN
  Administered 2017-10-14: 10 mg via ORAL
  Filled 2017-10-14: qty 2

## 2017-10-14 MED ORDER — PROMETHAZINE HCL 25 MG RE SUPP: 25.0000 mg | Freq: Three times a day (TID) | RECTAL | 10 refills | Status: AC | PRN

## 2017-10-14 MED ORDER — FENTANYL CITRATE (PF) 100 MCG/2ML IJ SOLN
INTRAMUSCULAR | Status: DC | PRN
  Administered 2017-10-14 (×2): 50 ug via INTRAVENOUS
  Administered 2017-10-14 (×2): 25 ug via INTRAVENOUS
  Administered 2017-10-14 (×2): 50 ug via INTRAVENOUS
  Administered 2017-10-14: 100 ug via INTRAVENOUS
  Administered 2017-10-14 (×4): 50 ug via INTRAVENOUS

## 2017-10-14 MED ORDER — SODIUM CHLORIDE 0.9 % IV SOLN: 250.0000 mL | INTRAVENOUS | Status: DC | PRN

## 2017-10-14 MED ORDER — METHOCARBAMOL 500 MG PO TABS
750.0000 mg | ORAL_TABLET | Freq: Four times a day (QID) | ORAL | Status: DC | PRN
  Administered 2017-10-15: 750 mg via ORAL
  Filled 2017-10-14: qty 2

## 2017-10-14 MED ORDER — FENTANYL CITRATE (PF) 100 MCG/2ML IJ SOLN
INTRAMUSCULAR | Status: AC
  Filled 2017-10-14: qty 2

## 2017-10-14 MED ORDER — PROCHLORPERAZINE EDISYLATE 10 MG/2ML IJ SOLN: 5.0000 mg | Freq: Four times a day (QID) | INTRAMUSCULAR | Status: DC | PRN

## 2017-10-14 MED ORDER — MELATONIN 5 MG PO TABS
20.0000 mg | ORAL_TABLET | Freq: Every day | ORAL | Status: DC
  Administered 2017-10-14 – 2017-10-16 (×3): 20 mg via ORAL
  Filled 2017-10-14 (×3): qty 4

## 2017-10-14 MED ORDER — LIDOCAINE 2% (20 MG/ML) 5 ML SYRINGE
INTRAMUSCULAR | Status: AC
  Filled 2017-10-14: qty 5

## 2017-10-14 MED ORDER — SODIUM CHLORIDE 0.9% FLUSH
3.0000 mL | Freq: Two times a day (BID) | INTRAVENOUS | Status: DC
  Administered 2017-10-15: 3 mL via INTRAVENOUS

## 2017-10-14 MED ORDER — GABAPENTIN 300 MG PO CAPS
300.0000 mg | ORAL_CAPSULE | Freq: Two times a day (BID) | ORAL | Status: DC
  Administered 2017-10-14 – 2017-10-17 (×6): 300 mg via ORAL
  Filled 2017-10-14 (×6): qty 1

## 2017-10-14 MED ORDER — DEXMEDETOMIDINE HCL IN NACL 200 MCG/50ML IV SOLN
INTRAVENOUS | Status: AC
  Filled 2017-10-14: qty 50

## 2017-10-14 MED ORDER — SODIUM CHLORIDE 0.9 % IV SOLN
INTRAVENOUS | Status: DC
  Administered 2017-10-14: 18:00:00 via INTRAVENOUS

## 2017-10-14 MED ORDER — TRAMADOL HCL 50 MG PO TABS: 50.0000 mg | ORAL_TABLET | Freq: Four times a day (QID) | ORAL | 0 refills | Status: AC | PRN

## 2017-10-14 MED ORDER — ONDANSETRON HCL 4 MG/2ML IJ SOLN
INTRAMUSCULAR | Status: AC
  Filled 2017-10-14: qty 2

## 2017-10-14 MED ORDER — SODIUM CHLORIDE 0.9 % IV SOLN
2.0000 g | INTRAVENOUS | Status: AC
  Administered 2017-10-14: 2 g via INTRAVENOUS
  Filled 2017-10-14: qty 20

## 2017-10-14 MED ORDER — PHENYLEPHRINE 40 MCG/ML (10ML) SYRINGE FOR IV PUSH (FOR BLOOD PRESSURE SUPPORT)
PREFILLED_SYRINGE | INTRAVENOUS | Status: DC | PRN
  Administered 2017-10-14: 80 ug via INTRAVENOUS
  Administered 2017-10-14: 120 ug via INTRAVENOUS
  Administered 2017-10-14 (×3): 80 ug via INTRAVENOUS

## 2017-10-14 MED ORDER — PHENYLEPHRINE HCL 10 MG/ML IJ SOLN
INTRAMUSCULAR | Status: DC | PRN
  Administered 2017-10-14: 50 ug/min via INTRAVENOUS

## 2017-10-14 MED ORDER — BUPIVACAINE-EPINEPHRINE 0.25% -1:200000 IJ SOLN
INTRAMUSCULAR | Status: DC | PRN
  Administered 2017-10-14: 60 mL

## 2017-10-14 MED ORDER — DEXAMETHASONE SODIUM PHOSPHATE 4 MG/ML IJ SOLN
4.0000 mg | INTRAMUSCULAR | Status: DC
  Filled 2017-10-14: qty 1

## 2017-10-14 MED ORDER — PROCHLORPERAZINE MALEATE 10 MG PO TABS: 10.0000 mg | ORAL_TABLET | Freq: Four times a day (QID) | ORAL | Status: DC | PRN

## 2017-10-14 MED ORDER — CELECOXIB 200 MG PO CAPS
200.0000 mg | ORAL_CAPSULE | ORAL | Status: AC
  Administered 2017-10-14: 200 mg via ORAL
  Filled 2017-10-14: qty 1

## 2017-10-14 MED ORDER — ROCURONIUM BROMIDE 10 MG/ML (PF) SYRINGE
PREFILLED_SYRINGE | INTRAVENOUS | Status: AC
  Filled 2017-10-14: qty 10

## 2017-10-14 MED ORDER — HYDRALAZINE HCL 20 MG/ML IJ SOLN: 5.0000 mg | INTRAMUSCULAR | Status: DC | PRN

## 2017-10-14 MED ORDER — IBUPROFEN 200 MG PO TABS: 400.0000 mg | ORAL_TABLET | Freq: Every day | ORAL | Status: DC | PRN

## 2017-10-14 MED ORDER — OXYMETAZOLINE HCL 0.05 % NA SOLN
1.0000 | Freq: Every day | NASAL | Status: DC | PRN
  Filled 2017-10-14: qty 15

## 2017-10-14 MED ORDER — ONDANSETRON HCL 4 MG PO TABS: 4.0000 mg | ORAL_TABLET | Freq: Three times a day (TID) | ORAL | 5 refills | Status: AC | PRN

## 2017-10-14 MED ORDER — PROPOFOL 10 MG/ML IV BOLUS
INTRAVENOUS | Status: DC | PRN
  Administered 2017-10-14: 200 mg via INTRAVENOUS

## 2017-10-14 MED ORDER — HYDROMORPHONE HCL 1 MG/ML IJ SOLN
0.2500 mg | INTRAMUSCULAR | Status: DC | PRN
  Administered 2017-10-14: 0.5 mg via INTRAVENOUS
  Administered 2017-10-14: 0.25 mg via INTRAVENOUS

## 2017-10-14 MED ORDER — ACETAMINOPHEN 500 MG PO TABS
1000.0000 mg | ORAL_TABLET | ORAL | Status: AC
  Administered 2017-10-14: 1000 mg via ORAL
  Filled 2017-10-14: qty 2

## 2017-10-14 MED ORDER — ACETAMINOPHEN 500 MG PO TABS
1000.0000 mg | ORAL_TABLET | Freq: Three times a day (TID) | ORAL | Status: DC
  Administered 2017-10-14 – 2017-10-16 (×6): 1000 mg via ORAL
  Filled 2017-10-14 (×7): qty 2

## 2017-10-14 MED ORDER — DIPHENHYDRAMINE HCL 50 MG/ML IJ SOLN: 12.5000 mg | Freq: Four times a day (QID) | INTRAMUSCULAR | Status: DC | PRN

## 2017-10-14 MED ORDER — ROCURONIUM BROMIDE 50 MG/5ML IV SOSY
PREFILLED_SYRINGE | INTRAVENOUS | Status: DC | PRN
  Administered 2017-10-14: 70 mg via INTRAVENOUS
  Administered 2017-10-14 (×3): 20 mg via INTRAVENOUS

## 2017-10-14 MED ORDER — ONDANSETRON HCL 4 MG/2ML IJ SOLN
INTRAMUSCULAR | Status: DC | PRN
  Administered 2017-10-14: 4 mg via INTRAVENOUS

## 2017-10-14 MED ORDER — LACTATED RINGERS IR SOLN
Status: DC | PRN
  Administered 2017-10-14: 1000 mL

## 2017-10-14 MED ORDER — LIP MEDEX EX OINT
1.0000 "application " | TOPICAL_OINTMENT | Freq: Two times a day (BID) | CUTANEOUS | Status: DC
  Administered 2017-10-14 – 2017-10-16 (×5): 1 via TOPICAL
  Filled 2017-10-14: qty 7

## 2017-10-14 MED ORDER — DEXTROSE 5 % IV SOLN
1000.0000 mg | Freq: Four times a day (QID) | INTRAVENOUS | Status: DC | PRN
  Administered 2017-10-14: 1000 mg via INTRAVENOUS
  Filled 2017-10-14: qty 10

## 2017-10-14 MED ORDER — MIDAZOLAM HCL 5 MG/5ML IJ SOLN
INTRAMUSCULAR | Status: DC | PRN
  Administered 2017-10-14: 2 mg via INTRAVENOUS

## 2017-10-14 MED ORDER — DEXAMETHASONE SODIUM PHOSPHATE 10 MG/ML IJ SOLN
INTRAMUSCULAR | Status: DC | PRN
  Administered 2017-10-14: 7 mg via INTRAVENOUS

## 2017-10-14 MED ORDER — ENOXAPARIN SODIUM 40 MG/0.4ML ~~LOC~~ SOLN
40.0000 mg | SUBCUTANEOUS | Status: DC
  Administered 2017-10-15 – 2017-10-17 (×3): 40 mg via SUBCUTANEOUS
  Filled 2017-10-14 (×3): qty 0.4

## 2017-10-14 MED ORDER — PROPOFOL 10 MG/ML IV BOLUS
INTRAVENOUS | Status: AC
  Filled 2017-10-14: qty 20

## 2017-10-14 MED ORDER — PROMETHAZINE HCL 25 MG/ML IJ SOLN: 6.2500 mg | INTRAMUSCULAR | Status: DC | PRN

## 2017-10-14 MED ORDER — ONDANSETRON HCL 4 MG/2ML IJ SOLN
4.0000 mg | Freq: Four times a day (QID) | INTRAMUSCULAR | Status: DC | PRN
  Filled 2017-10-14: qty 2

## 2017-10-14 MED ORDER — DIPHENHYDRAMINE HCL 12.5 MG/5ML PO ELIX: 12.5000 mg | ORAL_SOLUTION | Freq: Four times a day (QID) | ORAL | Status: DC | PRN

## 2017-10-14 MED ORDER — ENALAPRILAT 1.25 MG/ML IV SOLN
0.6250 mg | Freq: Four times a day (QID) | INTRAVENOUS | Status: DC | PRN
  Filled 2017-10-14: qty 1

## 2017-10-14 MED ORDER — MEPERIDINE HCL 50 MG/ML IJ SOLN: 6.2500 mg | INTRAMUSCULAR | Status: DC | PRN

## 2017-10-14 MED ORDER — HYDROMORPHONE HCL 1 MG/ML IJ SOLN
INTRAMUSCULAR | Status: AC
  Administered 2017-10-14: 0.5 mg via INTRAVENOUS
  Filled 2017-10-14: qty 1

## 2017-10-14 MED ORDER — 0.9 % SODIUM CHLORIDE (POUR BTL) OPTIME
TOPICAL | Status: DC | PRN
  Administered 2017-10-14: 2000 mL

## 2017-10-14 MED ORDER — SODIUM CHLORIDE 0.9% FLUSH: 3.0000 mL | INTRAVENOUS | Status: DC | PRN

## 2017-10-14 MED ORDER — METOCLOPRAMIDE HCL 5 MG/ML IJ SOLN: 5.0000 mg | Freq: Three times a day (TID) | INTRAMUSCULAR | Status: DC | PRN

## 2017-10-14 MED ORDER — LACTATED RINGERS IV SOLN: 1000.0000 mL | Freq: Three times a day (TID) | INTRAVENOUS | Status: AC | PRN

## 2017-10-14 MED ORDER — LIDOCAINE 2% (20 MG/ML) 5 ML SYRINGE
INTRAMUSCULAR | Status: DC | PRN
  Administered 2017-10-14: 50 mg via INTRAVENOUS

## 2017-10-14 MED ORDER — MIDAZOLAM HCL 2 MG/2ML IJ SOLN
INTRAMUSCULAR | Status: AC
  Filled 2017-10-14: qty 2

## 2017-10-14 MED ORDER — ENSURE SURGERY PO LIQD
237.0000 mL | Freq: Two times a day (BID) | ORAL | Status: DC
  Administered 2017-10-17: 237 mL via ORAL
  Filled 2017-10-14 (×7): qty 237

## 2017-10-14 MED ORDER — ONDANSETRON 4 MG PO TBDP
4.0000 mg | ORAL_TABLET | Freq: Four times a day (QID) | ORAL | Status: DC | PRN
  Administered 2017-10-15: 4 mg via ORAL
  Filled 2017-10-14: qty 1

## 2017-10-14 MED ORDER — SCOPOLAMINE 1 MG/3DAYS TD PT72
1.0000 | MEDICATED_PATCH | TRANSDERMAL | Status: DC
  Administered 2017-10-14: 1.5 mg via TRANSDERMAL
  Filled 2017-10-14: qty 1

## 2017-10-14 MED ORDER — DEXMEDETOMIDINE HCL IN NACL 200 MCG/50ML IV SOLN
INTRAVENOUS | Status: DC | PRN
  Administered 2017-10-14: 0.4 ug/kg/h via INTRAVENOUS

## 2017-10-14 MED ORDER — LACTATED RINGERS IV SOLN
INTRAVENOUS | Status: DC
  Administered 2017-10-14 (×2): via INTRAVENOUS

## 2017-10-14 MED ORDER — PHENYLEPHRINE HCL 10 MG/ML IJ SOLN
INTRAMUSCULAR | Status: AC
  Filled 2017-10-14: qty 2

## 2017-10-14 MED ORDER — ATORVASTATIN CALCIUM 40 MG PO TABS
40.0000 mg | ORAL_TABLET | Freq: Every evening | ORAL | Status: DC
  Administered 2017-10-14 – 2017-10-16 (×3): 40 mg via ORAL
  Filled 2017-10-14 (×3): qty 1

## 2017-10-14 MED ORDER — DEXAMETHASONE SODIUM PHOSPHATE 10 MG/ML IJ SOLN
INTRAMUSCULAR | Status: AC
  Filled 2017-10-14: qty 1

## 2017-10-14 MED ORDER — METOPROLOL TARTRATE 5 MG/5ML IV SOLN: 5.0000 mg | Freq: Four times a day (QID) | INTRAVENOUS | Status: DC | PRN

## 2017-10-14 MED ORDER — ESOMEPRAZOLE MAGNESIUM 20 MG PO CPDR
20.0000 mg | DELAYED_RELEASE_CAPSULE | Freq: Two times a day (BID) | ORAL | Status: DC
  Administered 2017-10-15 – 2017-10-17 (×5): 20 mg via ORAL
  Filled 2017-10-14 (×6): qty 1

## 2017-10-14 MED ORDER — SIMETHICONE 80 MG PO CHEW
40.0000 mg | CHEWABLE_TABLET | Freq: Four times a day (QID) | ORAL | Status: DC
  Administered 2017-10-14 – 2017-10-17 (×11): 40 mg via ORAL
  Filled 2017-10-14 (×11): qty 1

## 2017-10-14 MED ORDER — BISACODYL 10 MG RE SUPP: 10.0000 mg | Freq: Every day | RECTAL | Status: DC | PRN

## 2017-10-14 MED ORDER — PHENYLEPHRINE 40 MCG/ML (10ML) SYRINGE FOR IV PUSH (FOR BLOOD PRESSURE SUPPORT)
PREFILLED_SYRINGE | INTRAVENOUS | Status: AC
  Filled 2017-10-14: qty 10

## 2017-10-14 MED ORDER — HYDROMORPHONE HCL 1 MG/ML IJ SOLN
0.5000 mg | INTRAMUSCULAR | Status: DC | PRN
  Administered 2017-10-14 – 2017-10-15 (×2): 1 mg via INTRAVENOUS
  Filled 2017-10-14 (×2): qty 1

## 2017-10-14 MED ORDER — ENSURE PRE-SURGERY PO LIQD
296.0000 mL | Freq: Once | ORAL | Status: DC
  Filled 2017-10-14: qty 296

## 2017-10-14 MED ORDER — FAMOTIDINE 10 MG PO TABS
10.0000 mg | ORAL_TABLET | Freq: Two times a day (BID) | ORAL | Status: DC | PRN
  Filled 2017-10-14: qty 1

## 2017-10-14 MED ORDER — POLYETHYLENE GLYCOL 3350 17 G PO PACK: 17.0000 g | PACK | Freq: Every day | ORAL | Status: DC | PRN

## 2017-10-14 MED ORDER — SUGAMMADEX SODIUM 200 MG/2ML IV SOLN
INTRAVENOUS | Status: DC | PRN
  Administered 2017-10-14: 400 mg via INTRAVENOUS

## 2017-10-14 MED ORDER — GABAPENTIN 300 MG PO CAPS
300.0000 mg | ORAL_CAPSULE | ORAL | Status: AC
  Administered 2017-10-14: 300 mg via ORAL
  Filled 2017-10-14: qty 1

## 2017-10-14 MED ORDER — SCOPOLAMINE 1 MG/3DAYS TD PT72SCOPOLAMINE 1 MG/3DAYS
1.0000 | MEDICATED_PATCH | TRANSDERMAL | Status: DC
Start: 2017-10-14 — End: 2017-10-14

## 2017-10-14 MED ORDER — BUPIVACAINE-EPINEPHRINE (PF) 0.25% -1:200000 IJ SOLN
INTRAMUSCULAR | Status: AC
  Filled 2017-10-14: qty 60

## 2017-10-14 MED ORDER — MAGIC MOUTHWASH
15.0000 mL | Freq: Four times a day (QID) | ORAL | Status: DC | PRN
  Filled 2017-10-14: qty 15

## 2017-10-14 MED ORDER — ENSURE PRE-SURGERY PO LIQD
592.0000 mL | Freq: Once | ORAL | Status: DC
  Filled 2017-10-14: qty 592

## 2017-10-14 MED ORDER — FENTANYL CITRATE (PF) 250 MCG/5ML IJ SOLN
INTRAMUSCULAR | Status: AC
  Filled 2017-10-14: qty 5

## 2017-10-14 SURGICAL SUPPLY — 61 items
APPLICATOR COTTON TIP 6IN STRL (MISCELLANEOUS) ×2 IMPLANT
APPLIER CLIP 5 13 M/L LIGAMAX5 (MISCELLANEOUS)
APPLIER CLIP ROT 10 11.4 M/L (STAPLE)
BLADE SURG SZ11 CARB STEEL (BLADE) ×2 IMPLANT
CHLORAPREP W/TINT 26ML (MISCELLANEOUS) ×2 IMPLANT
CLIP APPLIE 5 13 M/L LIGAMAX5 (MISCELLANEOUS) IMPLANT
CLIP APPLIE ROT 10 11.4 M/L (STAPLE) IMPLANT
COVER SURGICAL LIGHT HANDLE (MISCELLANEOUS) ×2 IMPLANT
COVER TIP SHEARS 8 DVNC (MISCELLANEOUS) ×1 IMPLANT
COVER TIP SHEARS 8MM DA VINCI (MISCELLANEOUS) ×1
DECANTER SPIKE VIAL GLASS SM (MISCELLANEOUS) ×2 IMPLANT
DRAIN CHANNEL 19F RND (DRAIN) IMPLANT
DRAIN PENROSE 18X1/2 LTX STRL (DRAIN) IMPLANT
DRAPE ARM DVNC X/XI (DISPOSABLE) ×4 IMPLANT
DRAPE COLUMN DVNC XI (DISPOSABLE) ×1 IMPLANT
DRAPE DA VINCI XI ARM (DISPOSABLE) ×4
DRAPE DA VINCI XI COLUMN (DISPOSABLE) ×1
DRAPE WARM FLUID 44X44 (DRAPE) ×2 IMPLANT
DRSG TEGADERM 2-3/8X2-3/4 SM (GAUZE/BANDAGES/DRESSINGS) ×8 IMPLANT
ELECT REM PT RETURN 15FT ADLT (MISCELLANEOUS) ×2 IMPLANT
ENDOLOOP SUT PDS II  0 18 (SUTURE)
ENDOLOOP SUT PDS II 0 18 (SUTURE) IMPLANT
EVACUATOR SILICONE 100CC (DRAIN) IMPLANT
FELT TEFLON 4 X1 (Mesh General) ×2 IMPLANT
GAUZE SPONGE 2X2 8PLY STRL LF (GAUZE/BANDAGES/DRESSINGS) ×1 IMPLANT
GLOVE ECLIPSE 8.0 STRL XLNG CF (GLOVE) ×4 IMPLANT
GLOVE INDICATOR 8.0 STRL GRN (GLOVE) ×4 IMPLANT
GOWN STRL REUS W/TWL XL LVL3 (GOWN DISPOSABLE) ×8 IMPLANT
IRRIG SUCT STRYKERFLOW 2 WTIP (MISCELLANEOUS) ×2
IRRIGATION SUCT STRKRFLW 2 WTP (MISCELLANEOUS) ×1 IMPLANT
KIT BASIN OR (CUSTOM PROCEDURE TRAY) ×2 IMPLANT
MESH PHASIX RESORB RECT 10X15 (Mesh General) ×2 IMPLANT
NEEDLE HYPO 22GX1.5 SAFETY (NEEDLE) ×2 IMPLANT
NEEDLE INSUFFLATION 14GA 120MM (NEEDLE) ×2 IMPLANT
PACK CARDIOVASCULAR III (CUSTOM PROCEDURE TRAY) ×2 IMPLANT
PAD POSITIONING PINK XL (MISCELLANEOUS) ×2 IMPLANT
SCISSORS LAP 5X45 EPIX DISP (ENDOMECHANICALS) ×2 IMPLANT
SEAL CANN UNIV 5-8 DVNC XI (MISCELLANEOUS) ×4 IMPLANT
SEAL XI 5MM-8MM UNIVERSAL (MISCELLANEOUS) ×4
SEALER VESSEL DA VINCI XI (MISCELLANEOUS) ×1
SEALER VESSEL EXT DVNC XI (MISCELLANEOUS) ×1 IMPLANT
SOLUTION ELECTROLUBE (MISCELLANEOUS) ×2 IMPLANT
SPONGE GAUZE 2X2 STER 10/PKG (GAUZE/BANDAGES/DRESSINGS) ×1
SPONGE LAP 18X18 RF (DISPOSABLE) ×2 IMPLANT
SUT ETHIBOND 0 36 GRN (SUTURE) ×6 IMPLANT
SUT ETHIBOND NAB CT1 #1 30IN (SUTURE) ×4 IMPLANT
SUT MNCRL AB 4-0 PS2 18 (SUTURE) ×2 IMPLANT
SUT PROLENE 2 0 SH DA (SUTURE) IMPLANT
SUT V-LOC BARB 180 2/0GR6 GS22 (SUTURE) ×2
SUTURE V-LC BRB 180 2/0GR6GS22 (SUTURE) ×1 IMPLANT
SYR 10ML LL (SYRINGE) ×2 IMPLANT
SYR 20CC LL (SYRINGE) ×2 IMPLANT
TIP INNERVISION DETACH 40FR (MISCELLANEOUS) IMPLANT
TIP INNERVISION DETACH 50FR (MISCELLANEOUS) IMPLANT
TIP INNERVISION DETACH 56FR (MISCELLANEOUS) ×2 IMPLANT
TIPS INNERVISION DETACH 40FR (MISCELLANEOUS)
TOWEL OR 17X26 10 PK STRL BLUE (TOWEL DISPOSABLE) ×2 IMPLANT
TOWEL OR NON WOVEN STRL DISP B (DISPOSABLE) ×2 IMPLANT
TRAY FOLEY MTR SLVR 16FR STAT (SET/KITS/TRAYS/PACK) IMPLANT
TROCAR ADV FIXATION 5X100MM (TROCAR) ×2 IMPLANT
TUBING INSUFFLATION 10FT LAP (TUBING) ×2 IMPLANT

## 2017-10-14 NOTE — Op Note (Addendum)
10/14/2017  4:18 PM  PATIENT:  Victor David  42 y.o. 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 ROBOTIC reduction of paraesophageal hiatal hernia 2. Type II mediastinal dissection. 3. Primary repair of hiatal hernia over pledgets.  4. Anterior & posterior gastropexy. 5. Nissen fundoplication 2 cm over a 56-French bougie 6. Mesh reinforcement with absorbable mesh  SURGEON:  Ardeth Sportsman, MD  ASSIST:  Marin Olp, MD  ANESTHESIA:   local and general  EBL:  Total I/O In: 1000 [I.V.:1000] Out: -   Delay start of Pharmacological VTE agent (>24hrs) due to surgical blood loss or risk of bleeding:  no  ANESTHESIA: 1. General anesthesia. 2. Local anesthetic in a field block around all port sites.  SPECIMEN:  Mediastinal hernia sac (not sent).  DRAINS: NONE  COUNTS:  YES  PLAN OF CARE: Admit for overnight observation  PATIENT DISPOSITION:  PACU - hemodynamically stable.  INDICATION:   Patient with symptomatic paraesophageal hiatal hernia.  Esophagitis with heartburn and reflux refractory to medical management.  The patient has had extensive work-up & we feel the patient will benefit from repair:  The anatomy & physiology of the foregut and anti-reflux mechanism was discussed.  The pathophysiology of hiatal herniation and GERD was discussed.  Natural history risks without surgery was discussed.   The patient's symptoms are not adequately controlled by medicines and other non-operative treatments.  I feel the risks of no intervention will lead to serious  problems that outweigh the operative risks; therefore, I recommended surgery to reduce the hiatal hernia out of the chest and fundoplication to rebuild the anti-reflux valve and control reflux better.  Need for a thorough workup to rule out the differential diagnosis and plan treatment was explained.  I explained laparoscopic techniques with possible need for an open approach.  Risks such as bleeding, infection, abscess, leak, need for further treatment, heart attack, death, and other risks were discussed.   I noted a good likelihood this will help address the problem.  Goals of post-operative recovery were discussed as well.  Possibility that this will not correct all symptoms was explained.  Post-operative dysphagia, need for short-term liquid & pureed diet, inability to vomit, possibility of reherniation, possible need for medicines to help control symptoms in addition to surgery were discussed.  We will work to minimize complications.   Educational handouts further explaining the pathology, treatment options, and dysphagia diet was given as well.  Questions were answered.  The patient expresses understanding & wishes to proceed with surgery.  OR FINDINGS:   Moderate-sized paraesophageal hiatal hernia with 20% of the stomach in the mediastinum.  There was a 7x6 cm hiatal defect.  It is a primary repair over pledgets. Mesh reinforcement was used with Mesh was used: PhasixT Mesh (a knitted monofilament mesh scaffold using Poly-4-hydroxybutyrate (P4HB), a biologically derived, fully resorbable material)  The patient has a 2 cm Nissen fundoplication that was done over 56-French bougie.  The patient has had anterior and posterior gastropexy.  DESCRIPTION:   Informed consent was confirmed.  The patient received IV antibiotics prior to incision.  The underwent general anesthesia without difficulty.  A Foley catheter  sterilely placed.  The patient was positioned in split leg with arms tucked. The abdomen  was prepped and draped in the sterile fashion.  Surgical time-out confirmed our plan.  I placed a 8 mm port in the left subcostal region using Varess entry technique with the patient in steep reverse Trendelenburg and left side up.  Entry was clean.  We induced carbon dioxide insufflation.  Camera inspection revealed no injury.  Under direct visualization, I placed extra ports.  I also placed a 5 mm port in the left subxiphoid region under direct visualization.  I removed that and placed an Omega-shaped rigid Nathanson liver retractor to lift the left lateral sector of the liver anteriorly to expose the esophageal hiatus.  This was secured to the bed using the iron man system.  The Xi robot was carefully docked and instruments placed and advanced under direct visualization.  We focused on dissection.  Patient had some moderate epiphrenic fat pads in some swelling at the esophageal hiatus consistent with an incarcerated hernia.  The majority the stomach was still in the abdomen though.  We grasped the anterior mediastinal sac at the apex of the crus.  I scored through that and got into the anterior mediastinum.  I transected phrenoesophageal attachments to the inner right crus, preserving a two centimeter cuff of mediastinal sac until I found the base of the crura.  I then came around anteriorly on the left side and freed up the phrenoesophageal attachments of the mediastinal sac on the medial part of the left crus on the superior half.    We ligated the short gastrics along the lesser curvature of the stomach about a third the way down and then came up proximally over the fundus.  We released the attachments of the stomach to the retroperitoneum until we were able to connect with the prior dissection on the left crus.  We completed the release of phrenoesophageal attachments to the medial part of the left crus down to its base.  With this, we had circumferential mobilization.   He had a short but thickened  mediastinal hernia sac.  I was able to free the mediastinal sac from its attachments to the pericardium and bilateral pleura using primarily focused gentle blunt dissection as well as focused vessel sealer dissection. I did careful mediastinal dissection to free the mediastinal sac.  Marland Kitchen    We placed the stomach and esophagus on axial tension.  I then did a Type II mediastinal dissection where I freed the esophagus from its attachments to the aorta, spine, pleura, and pericardium using primarily gentle blunt as well as focused ultrasonic dissection.  We saw the anterior & posterior vagus nerves intact.  We preserved it at all times.  I procedded to dissect about 15 cm proximally into the mediastinum.  With that I could straighten out the esophagus and get 4 cm of intra-abdominal length of the esophagus at a best estimation.  I freed the anterior mediastinal sac off the esophagus & stomach.  We saw the anterior vagus nerve and freed the sac off of the vagus.  I dissected out & removed the fatty  epiphrenic pads at the esophagogastric junction. With that, I could better define the esophagogastric junction.  I confirmed the the patient had 4 cm of intra-abdominal esophageal length off tension.  I brought the fundus of the stomach posterior to the esophagus over to the right side.  The wrap was mobile with the classic shoe shine maneuver.  Wrap became  together gently.  We reflected the stomach left laterally and closed the esophageal hiatus using 0 Ethibond stitch using horizontal mattress stitches with pledgets on both sides.  I did that x3 stitches.  The crura had good substance and they came together well without any tension.  Because of his obesity with the moderate size hiatal hernia defect and young age, I reinforced the repair using a 15 x 10 cm biologic Phasix mesh.  I cut out a 2x4 cm part of mesh in the middle third of the mesh such that the mesh had a broad U shape transversely, one tail 5 cm wide & the  other 8cm.  We brought the mesh in and laid it over the crural repair, tails anterior over the crura.  I tucked the broader "U" tail of the mesh between the left diaphragm and the spleen, the narrower "U" tail over the right crus.  I then secured the posterior & anterior corners of the narrower"U" tail with 0 Ethibond upper interrupted suture to the right crus.  I secured to the left lateral and left superior sides of the broader "U" tail to the left diaphragm band with 2-0 V lock running suture.   I brought the fundus of the stomach behind the esophagus and cardia to set up a fundoplication wrap.  I did a posterior gastropexy by taking of #1 Ethibond stitches to the posterior part of the right side of the wrap and thru the Phasix mesh and crural closure.  I did 3 of the stitches to do posterior gastropexy help incorporate the central part of the Phasix mesh to the crura closure I placed a similar stitch on the left anterior side as well.  That way the stomach covered the mesh and protected it from any esophageal exposure.  With the anterior and posterior gastropexy's, stomach laid well for a fundoplication wrap.   Next, Anesthesia passed a 56-French bougie transorally.  It was a lighted bougie and we did do this under axial tension.  It passed down easily without resistance.    I then did a classic 2 cm Nissen fundoplication on the true esophagus above the cardia using Ethibond stitch in the left side of the wrap, then anterior esophagus, and then right side of the wrap and tied that down. Did 3 stitches.  We measured it and it was 2 cm in length.  We removed the bougie.  It was intact.    The wrap was soft and floppy.  Because he did not have a giant mediastinal hernia sac, I did not feel the drain was needed to be placed.   I did irrigation and ensured hemostasis.  I saw no evidence of any leak or perforation or other abnormality.  I removed the [facility] liver retractor under direct visualization.  I  evacuated carbon dioxide and removed the ports.  Defects were small, so I do not feel any more aggressive fascial closure needed to be done the skin was closed with Monocryl and sterile dressings applied.  The patient is being extubated and brought back to the recovery room.  I discussed postop care in detail with the patient in in the office.  Discussed again with the patient & his wife in the holding area. I discussed operative findings, updated the patient's status, discussed probable steps to recovery, and gave postoperative recommendations to the patient's spouse.  Recommendations were made.  Questions were answered.  She expressed understanding & appreciation.  Ardeth Sportsman, MD, FACS, MASCRS Gastrointestinal  and Minimally Invasive Surgery    [address] [place], [place] [address] [phone] Main / Paging [phone] Fax

## 2017-10-14 NOTE — Anesthesia Procedure Notes (Signed)
Procedure Name: Intubation Date/Time: 10/14/2017 1:38 PM Performed by: Vanessa Durhamochran, Shaqueena Mauceri Glenn, CRNA Pre-anesthesia Checklist: Patient identified, Emergency Drugs available, Suction available and Patient being monitored Patient Re-evaluated:Patient Re-evaluated prior to induction Oxygen Delivery Method: Circle system utilized Preoxygenation: Pre-oxygenation with 100% oxygen Induction Type: IV induction Ventilation: Mask ventilation without difficulty Laryngoscope Size: 2 and Miller Grade View: Grade I Tube type: Oral Tube size: 7.5 mm Number of attempts: 1 Airway Equipment and Method: Stylet Placement Confirmation: ETT inserted through vocal cords under direct vision,  positive ETCO2 and breath sounds checked- equal and bilateral Secured at: 23 cm Tube secured with: Tape Dental Injury: Teeth and Oropharynx as per pre-operative assessment

## 2017-10-14 NOTE — Anesthesia Preprocedure Evaluation (Signed)
Anesthesia Evaluation  Patient identified by MRN, date of birth, ID band Patient awake    Reviewed: Allergy & Precautions, NPO status , Patient's Chart, lab work & pertinent test results  Airway Mallampati: II  TM Distance: >3 FB Neck ROM: Full    Dental no notable dental hx. (+) Teeth Intact   Pulmonary former smoker,    breath sounds clear to auscultation       Cardiovascular  Rhythm:Regular Rate:Tachycardia  Tachycardia   Neuro/Psych negative neurological ROS  negative psych ROS   GI/Hepatic Neg liver ROS, hiatal hernia, GERD  Medicated and Controlled,  Endo/Other  Hypercholesterolemia Obesity  Renal/GU negative Renal ROS  negative genitourinary   Musculoskeletal negative musculoskeletal ROS (+)   Abdominal (+) + obese,   Peds  Hematology negative hematology ROS (+)   Anesthesia Other Findings   Reproductive/Obstetrics                             Anesthesia Physical Anesthesia Plan  ASA: II  Anesthesia Plan: General   Post-op Pain Management:    Induction: Intravenous, Rapid sequence and Cricoid pressure planned  PONV Risk Score and Plan: 4 or greater and Scopolamine patch - Pre-op, Ondansetron, Dexamethasone, Midazolam and Treatment may vary due to age or medical condition  Airway Management Planned: Oral ETT  Additional Equipment:   Intra-op Plan:   Post-operative Plan: Extubation in OR  Informed Consent: I have reviewed the patients History and Physical, chart, labs and discussed the procedure including the risks, benefits and alternatives for the proposed anesthesia with the patient or authorized representative who has indicated his/her understanding and acceptance.   Dental advisory given  Plan Discussed with: CRNA and Surgeon  Anesthesia Plan Comments:         Anesthesia Quick Evaluation

## 2017-10-14 NOTE — Anesthesia Postprocedure Evaluation (Signed)
Anesthesia Post Note  Patient: Victor David  Procedure(s) Performed: XI ROBOTIC ASSISTED REPAIR OF PARAESOPHAGEAL HIATAL HERNIA WITH FUNDOPLICATION (N/A ) INSERTION OF MESH (N/A )     Patient location during evaluation: PACU Anesthesia Type: General Level of consciousness: awake and alert Pain management: pain level controlled Vital Signs Assessment: post-procedure vital signs reviewed and stable Respiratory status: spontaneous breathing, nonlabored ventilation, respiratory function stable and patient connected to nasal cannula oxygen Cardiovascular status: blood pressure returned to baseline and stable Postop Assessment: no apparent nausea or vomiting Anesthetic complications: no    Last Vitals:  Vitals:   10/14/17 1715 10/14/17 1730  BP: 115/67   Pulse: 98   Resp: 12   Temp:  36.7 C  SpO2: 92%     Last Pain:  Vitals:   10/14/17 1700  TempSrc:   PainSc: 10-Worst pain ever                 Sontee Desena A.

## 2017-10-14 NOTE — H&P (Signed)
Victor David Documented: 08/03/2017 8:36 AM Location: [name] Patient #: [id number] DOB: 1975/06/25 Married / Language: English / Race: White Male  Patient Care Team: Burton Apley, MD as PCP - General (Internal Medicine) Karie Soda, MD as Consulting Physician (General Surgery) Marykay Lex, MD as Consulting Physician (Cardiology) Graylin Shiver, MD as Consulting Physician (Gastroenterology)   Chief Complaint: However hernia with persistent reflux, Barrett's, etc.  The patient is a pleasant obese male that has had heartburn and reflux issues for over a decade. He used to be able to control with Tums. However, he has had worsening symptoms. Diagnosed with Barrett's esophagus about 7 years ago. He's been taking Nexium symptoms. However he still gets reflux issues. Pepcid was added at night. He cannot lie down flat. He often gets nighttime emesis. He had an episode of chest pain that concerned him. [facility] and saw cardiology. While there is a family history of early cardiac death in his family, his workup was completely negative. He is avoid its tobacco for 10 years. However he has gained weight since he quit smoking. He is tried to get back to the gym and intentionally lose some weight. He usually moves his bowels about once or twice a day. He can walk several miles without difficulty. He denies any dysphagia liquids. Occasionally solids will sticker yesterday more slowly. When he bends over he definitely gets heartburn and reflux. Usually he can finish his meal okay. Some occasional bloating. He's never had any abdominal surgery. He saw gastroenterology. Endoscopy confirmed persistent Barrett's esophagus. Endoscopy and CT scan also noted a hiatal hernia. Given him having symptoms despite being on 2 medications and having Helicobacter pylori treatment done with still persistent symptoms, surgical consultation requested.  (Review of  systems as stated in this history (HPI) or in the review of systems. Otherwise all other 12 point ROS are negative) ` ` `   Past Surgical History (Tanisha A. Manson Passey, RMA; 08/03/2017 8:36 AM) Knee Surgery Right. Oral Surgery  Diagnostic Studies History (Tanisha A. Manson Passey, RMA; 08/03/2017 8:36 AM) Colonoscopy never  Allergies (Tanisha A. Manson Passey, RMA; 08/03/2017 8:37 AM) No Known Drug Allergies [08/03/2017]: Allergies Reconciled  Medication History (Tanisha A. Manson Passey, RMA; 08/03/2017 8:38 AM) Atorvastatin Calcium (40MG  Tablet, Oral) Active. NexIUM (20MG  Packet, Oral) Active. Aspirin (81MG  Tablet, Oral) Active. Medications Reconciled  Social History (Tanisha A. Manson Passey, RMA; 08/03/2017 8:36 AM) Alcohol use Occasional alcohol use. Caffeine use Carbonated beverages, Coffee, Tea. No drug use Tobacco use Former smoker.  Family History (Tanisha A. Manson Passey, RMA; 08/03/2017 8:36 AM) Alcohol Abuse Mother. Arthritis Father. Depression Father, Mother. Heart Disease Father. Heart disease in male family member before age 91  Other Problems (Tanisha A. Manson Passey, RMA; 08/03/2017 8:36 AM) Gastroesophageal Reflux Disease Hypercholesterolemia     Review of Systems (Tanisha A. Brown RMA; 08/03/2017 8:36 AM) General Not Present- Appetite Loss, Chills, Fatigue, Fever, Night Sweats, Weight Gain and Weight Loss. Skin Not Present- Change in Wart/Mole, Dryness, Hives, Jaundice, New Lesions, Non-Healing Wounds, Rash and Ulcer. HEENT Not Present- Earache, Hearing Loss, Hoarseness, Nose Bleed, Oral Ulcers, Ringing in the Ears, Seasonal Allergies, Sinus Pain, Sore Throat, Visual Disturbances, Wears glasses/contact lenses and Yellow Eyes. Respiratory Present- Snoring. Not Present- Bloody sputum, Chronic Cough, Difficulty Breathing and Wheezing. Breast Not Present- Breast Mass, Breast Pain, Nipple Discharge and Skin Changes. Cardiovascular Not Present- Chest Pain, Difficulty Breathing  Lying Down, Leg Cramps, Palpitations, Rapid Heart Rate, Shortness of Breath and Swelling of Extremities. Gastrointestinal Present- Indigestion. Not  Present- Abdominal Pain, Bloating, Bloody Stool, Change in Bowel Habits, Chronic diarrhea, Constipation, Difficulty Swallowing, Excessive gas, Gets full quickly at meals, Hemorrhoids, Nausea, Rectal Pain and Vomiting. Male Genitourinary Not Present- Blood in Urine, Change in Urinary Stream, Frequency, Impotence, Nocturia, Painful Urination, Urgency and Urine Leakage. Musculoskeletal Not Present- Back Pain, Joint Pain, Joint Stiffness, Muscle Pain, Muscle Weakness and Swelling of Extremities. Neurological Not Present- Decreased Memory, Fainting, Headaches, Numbness, Seizures, Tingling, Tremor, Trouble walking and Weakness. Psychiatric Not Present- Anxiety, Bipolar, Change in Sleep Pattern, Depression, Fearful and Frequent crying. Endocrine Not Present- Cold Intolerance, Excessive Hunger, Hair Changes, Heat Intolerance, Hot flashes and New Diabetes. Hematology Not Present- Blood Thinners, Easy Bruising, Excessive bleeding, Gland problems, HIV and Persistent Infections.  Vitals (Tanisha A. Brown RMA; 08/03/2017 8:37 AM) 08/03/2017 8:36 AM Weight: 281 lb Height: 72in Body Surface Area: 2.46 m Body Mass Index: 38.11 kg/m  Temp.: 98.38F  Pulse: 106 (Regular)  BP: 138/94 (Sitting, Left Arm, Standard)   BP (!) 132/95   Pulse (!) 106   Temp 98 F (36.7 C) (Oral)   Resp 18   Ht 6' (1.829 m)   Wt 126.6 kg (279 lb)   SpO2 100%   BMI 37.84 kg/m     Physical Exam Ardeth Sportsman MD; 08/03/2017 9:31 AM)  General Mental Status-Alert. General Appearance-Not in acute distress, Not Sickly. Orientation-Oriented X3. Hydration-Well hydrated. Voice-Normal.  Integumentary Global Assessment Upon inspection and palpation of skin surfaces of the - Axillae: non-tender, no inflammation or ulceration, no drainage. and Distribution  of scalp and body hair is normal. General Characteristics Temperature - normal warmth is noted.  Head and Neck Head-normocephalic, atraumatic with no lesions or palpable masses. Face Global Assessment - atraumatic, no absence of expression. Neck Global Assessment - no abnormal movements, no bruit auscultated on the right, no bruit auscultated on the left, no decreased range of motion, non-tender. Trachea-midline. Thyroid Gland Characteristics - non-tender.  Eye Eyeball - Left-Extraocular movements intact, No Nystagmus. Eyeball - Right-Extraocular movements intact, No Nystagmus. Cornea - Left-No Hazy. Cornea - Right-No Hazy. Sclera/Conjunctiva - Left-No scleral icterus, No Discharge. Sclera/Conjunctiva - Right-No scleral icterus, No Discharge. Pupil - Left-Direct reaction to light normal. Pupil - Right-Direct reaction to light normal.  ENMT Ears Pinna - Left - no drainage observed, no generalized tenderness observed. Right - no drainage observed, no generalized tenderness observed. Nose and Sinuses External Inspection of the Nose - no destructive lesion observed. Inspection of the nares - Left - quiet respiration. Right - quiet respiration. Mouth and Throat Lips - Upper Lip - no fissures observed, no pallor noted. Lower Lip - no fissures observed, no pallor noted. Nasopharynx - no discharge present. Oral Cavity/Oropharynx - Tongue - no dryness observed. Oral Mucosa - no cyanosis observed. Hypopharynx - no evidence of airway distress observed.  Chest and Lung Exam Inspection Movements - Normal and Symmetrical. Accessory muscles - No use of accessory muscles in breathing. Palpation Palpation of the chest reveals - Non-tender. Auscultation Breath sounds - Normal and Clear.  Cardiovascular Auscultation Rhythm - Regular. Murmurs & Other Heart Sounds - Auscultation of the heart reveals - No Murmurs and No Systolic Clicks.  Abdomen Inspection Inspection  of the abdomen reveals - No Visible peristalsis and No Abnormal pulsations. Umbilicus - No Bleeding, No Urine drainage. Palpation/Percussion Palpation and Percussion of the abdomen reveal - Soft, Non Tender, No Rebound tenderness, No Rigidity (guarding) and No Cutaneous hyperesthesia. Note: Abdomen obese but soft. Nontender. No diastases. Not distended. No umbilical  or incisional hernias. No guarding.  Male Genitourinary Sexual Maturity Tanner 5 - Adult hair pattern and Adult penile size and shape.  Peripheral Vascular Upper Extremity Inspection - Left - No Cyanotic nailbeds, Not Ischemic. Right - No Cyanotic nailbeds, Not Ischemic.  Neurologic Neurologic evaluation reveals -normal attention span and ability to concentrate, able to name objects and repeat phrases. Appropriate fund of knowledge , normal sensation and normal coordination. Mental Status Affect - not angry, not paranoid. Cranial Nerves-Normal Bilaterally. Gait-Normal.  Neuropsychiatric Mental status exam performed with findings of-able to articulate well with normal speech/language, rate, volume and coherence, thought content normal with ability to perform basic computations and apply abstract reasoning and no evidence of hallucinations, delusions, obsessions or homicidal/suicidal ideation.  Musculoskeletal Global Assessment Spine, Ribs and Pelvis - no instability, subluxation or laxity. Right Upper Extremity - no instability, subluxation or laxity.  Lymphatic Head & Neck  General Head & Neck Lymphatics: Bilateral - Description - No Localized lymphadenopathy. Axillary  General Axillary Region: Bilateral - Description - No Localized lymphadenopathy. Femoral & Inguinal  Generalized Femoral & Inguinal Lymphatics: Left - Description - No Localized lymphadenopathy. Right - Description - No Localized lymphadenopathy.    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 Refractory to Tums. Partially manageable with Nexium and Pepcid but still with nighttime reflux. Barrett's esophagus. Intermittent dysphagia to solids. He Helicobacter pylori infection status post treatment with persistent symptoms.  I think he is exhausting nonoperative management at this time.  Given his obesity, long-term results often can be better with gastric bypass with hiatal hernia repair. He is working to intentionally lose weight and avoid needing bariatric surgery. He would like to hold off on considering that.  He is reasonable candidate for hiatal hernia repair and fundoplication. It does not seem like a giant defect. Probably would consider absorbable mesh Phasix reinforcement to minimize recurrence in this obese patient. Esophageal manometry shows normal to slightly rapid contractions.  No cracker esophagus or other dysmotility Szo.  Normal gastric emptying as well.  Proceed with repair, mesh reinforcement, Nissen fundoplication.  The anatomy & physiology of the foregut and anti-reflux mechanism was discussed. The pathophysiology of hiatal herniation and GERD was discussed. Natural history risks without surgery was discussed. The patient's symptoms are not adequately controlled by medicines and other non-operative treatments. I feel the risks of no intervention will lead to serious problems that outweigh the operative risks; therefore, I recommended surgery to reduce the hiatal hernia out of the chest and fundoplication to rebuild the anti-reflux valve and control reflux better. Need for a thorough workup to rule out the differential diagnosis and plan treatment was explained. I explained laparoscopic techniques with possible need for an open approach.  Risks such as bleeding, infection, abscess, leak, need for further treatment, heart attack, death, and other risks were discussed. I noted a good likelihood this will  help address the problem. Goals of post-operative recovery were discussed as well. Possibility that this will not correct all symptoms was explained. Post-operative dysphagia, need for short-term liquid & pureed diet, inability to vomit, possibility of reherniation, possible need for medicines to help control symptoms in addition to surgery were discussed. We will work to minimize complications. Educational handouts further explaining the pathology, treatment options, and dysphagia diet was given as well. Questions were answered. The patient expresses understanding & wishes to proceed with surgery.   HISTORY OF HELICOBACTER PYLORI INFECTION (Z86.19) Impression: Biopsy and  treated by gastroenterology.   BARRETT'S ESOPHAGUS DETERMINED BY ENDOSCOPY (K22.70) Impression: Sometimes parents can regress if it is short segment after fundoplication. Most likely will need some endoscopic surveillance in the future per his gastroenterologist.   OBESITY (BMI 30-39.9) (E66.9) Impression: I did discuss given this fact he has hypertension and BMI of 35, he can be a candidate for weight loss surgery. He is intentionally trying to lose weight and wishes to avoid any bariatric surgery at this time. He would rather proceed with fundoplication and hiatal hernia repair  Ardeth Sportsman, M.D., F.A.C.S. Gastrointestinal and Minimally Invasive Surgery [facility] [address], [address] [place], [place] [address] [phone] Main / Paging

## 2017-10-14 NOTE — Transfer of Care (Signed)
Immediate Anesthesia Transfer of Care Note  Patient: Victor AbrahamsWesley Ayuso  Procedure(s) Performed: XI ROBOTIC ASSISTED REPAIR OF PARAESOPHAGEAL HIATAL HERNIA WITH FUNDOPLICATION (N/A ) INSERTION OF MESH (N/A )  Patient Location: PACU  Anesthesia Type:General  Level of Consciousness: awake, alert  and patient cooperative  Airway & Oxygen Therapy: Patient Spontanous Breathing and Patient connected to nasal cannula oxygen  Post-op Assessment: Report given to RN and Post -op Vital signs reviewed and stable  Post vital signs: Reviewed and stable  Last Vitals:  Vitals Value Taken Time  BP 108/72 10/14/2017  4:43 PM  Temp    Pulse 106 10/14/2017  4:45 PM  Resp 14 10/14/2017  4:45 PM  SpO2 96 % 10/14/2017  4:45 PM  Vitals shown include unvalidated device data.  Last Pain:  Vitals:   10/14/17 1106  TempSrc: Oral         Complications: No apparent anesthesia complications

## 2017-10-14 NOTE — Interval H&P Note (Signed)
History and Physical Interval Note:  10/14/2017 12:20 PM  Victor David  has presented today for surgery, with the diagnosis of Paraesophageal hiatal hernia refractory to medical management  The various methods of treatment have been discussed with the patient and family. After consideration of risks, benefits and other options for treatment, the patient has consented to  Procedure(s): XI ROBOTIC ASSISTED REPAIR OF PARAESOPHAGEAL HIATAL HERNIA WITH FUNDOPLICATION (N/A) INSERTION OF MESH (N/A) as a surgical intervention .  The patient's history has been reviewed, patient examined, no change in status, stable for surgery.  I have reviewed the patient's chart and labs.  Questions were answered to the patient's satisfaction.   I have re-reviewed the the patient's records, history, medications, and allergies.  I have re-examined the patient.  I again discussed intraoperative plans and goals of post-operative recovery.  The patient agrees to proceed.  Rodrigues Urbanek  Jul 18, 1975 [id number]  Patient Care Team: Burton Apley, MD as PCP - General (Internal Medicine) Karie Soda, MD as Consulting Physician (General Surgery) Marykay Lex, MD as Consulting Physician (Cardiology) Graylin Shiver, MD as Consulting Physician (Gastroenterology)  Patient Active Problem List   Diagnosis Date Noted  . Hiatal hernia 08/03/2017  . Barrett's esophagus 08/03/2017  . History of Helicobacter pylori infection 08/03/2017  . Pure hypercholesterolemia 08/03/2017  . Obesity (BMI 30-39.9) 08/03/2017  . Colitis 02/09/2016  . Tachycardia 02/09/2016  . Dehydration 02/09/2016  . GERD (gastroesophageal reflux disease) 02/09/2016    Past Medical History:  Diagnosis Date  . Barrett's esophagus   . Chest pain due to GERD   . History of hiatal hernia   . Mild pulmonary hypertension (HCC)     Past Surgical History:  Procedure Laterality Date  . ESOPHAGEAL MANOMETRY N/A 08/30/2017   Procedure: ESOPHAGEAL  MANOMETRY (EM);  Surgeon: Graylin Shiver, MD;  Location: [facility];  Service: Endoscopy;  Laterality: N/A;  . KNEE SURGERY Right    meniscus   . TUMOR REMOVAL     from finger   . WISDOM TOOTH EXTRACTION      Social History   Socioeconomic History  . Marital status: Married    Spouse name: Not on file  . Number of children: Not on file  . Years of education: Not on file  . Highest education level: Not on file  Occupational History  . Not on file  Social Needs  . Financial resource strain: Not on file  . Food insecurity:    Worry: Not on file    Inability: Not on file  . Transportation needs:    Medical: Not on file    Non-medical: Not on file  Tobacco Use  . Smoking status: Former Smoker    Packs/day: 1.00    Years: 20.00    Pack years: 20.00    Types: Cigarettes    Last attempt to quit: 10/09/2007    Years since quitting: 10.0  . Smokeless tobacco: Current User    Types: Chew  Substance and Sexual Activity  . Alcohol use: No  . Drug use: Not Currently    Types: Marijuana    Comment: las time was 2 years ago   . Sexual activity: Not on file  Lifestyle  . Physical activity:    Days per week: Not on file    Minutes per session: Not on file  . Stress: Not on file  Relationships  . Social connections:    Talks on phone: Not on file    Gets together: Not  on file    Attends religious service: Not on file    Active member of club or organization: Not on file    Attends meetings of clubs or organizations: Not on file    Relationship status: Not on file  . Intimate partner violence:    Fear of current or ex partner: Not on file    Emotionally abused: Not on file    Physically abused: Not on file    Forced sexual activity: Not on file  Other Topics Concern  . Not on file  Social History Narrative  . Not on file    Family History  Problem Relation Age of Onset  . Heart attack Father   . Heart attack Paternal Grandfather   . High Cholesterol Brother   .  High Cholesterol Brother     Medications Prior to Admission  Medication Sig Dispense Refill Last Dose  . atorvastatin (LIPITOR) 40 MG tablet Take 1 tablet (40 mg total) by mouth daily. (Patient taking differently: Take 40 mg by mouth every evening. ) 90 tablet 3 Taking  . esomeprazole (NEXIUM) 20 MG packet Take 20 mg by mouth 2 (two) times daily.    Past Week at Unknown time  . famotidine (PEPCID AC) 10 MG chewable tablet Chew 10 mg by mouth 2 (two) times daily as needed (stomach pain).   Past Week at Unknown time  . ibuprofen (ADVIL,MOTRIN) 200 MG tablet Take 400 mg by mouth daily as needed for headache, mild pain or moderate pain.    Past Month at Unknown time  . Melatonin 10 MG TABS Take 20 mg by mouth at bedtime.   10/13/2017 at 2100  . oxymetazoline (AFRIN) 0.05 % nasal spray Place 1 spray into both nostrils daily as needed for congestion.   10/14/2017 at Unknown time  . aspirin EC 81 MG tablet Take 1 tablet (81 mg total) by mouth daily. (Patient not taking: Reported on 10/05/2017) 90 tablet 3 Not Taking at Unknown time    Current Facility-Administered Medications  Medication Dose Route Frequency Provider Last Rate Last Dose  . cefTRIAXone (ROCEPHIN) 2 g in sodium chloride 0.9 % 100 mL IVPB  2 g Intravenous On Call to OR Cindi CarbonSwayne, Mary M, [facility]      . dexamethasone (DECADRON) injection 4 mg  4 mg Intravenous On Call to OR Karie SodaGross, Dorthula Bier, MD      . Melene Muller[START ON 10/15/2017] feeding supplement (ENSURE PRE-SURGERY) liquid 296 mL  296 mL Oral Once Karie SodaGross, Jasmon Graffam, MD      . feeding supplement (ENSURE PRE-SURGERY) liquid 592 mL  592 mL Oral Once Karie SodaGross, Macrina Lehnert, MD      . lactated ringers infusion   Intravenous Continuous Mal AmabileFoster, Michael, MD 10 mL/hr at 10/14/17 1142    . scopolamine (TRANSDERM-SCOP) 1 MG/3DAYS 1.5 mg  1 patch Transdermal On Call to OR Karie SodaGross, Ananth Fiallos, MD   1.5 mg at 10/14/17 1135  . scopolamine (TRANSDERM-SCOP) 1 MG/3DAYS 1.5 mg  1 patch Transdermal Q72H Mal AmabileFoster, Michael, MD         No Known  Allergies  BP (!) 132/95   Pulse (!) 106   Temp 98 F (36.7 C) (Oral)   Resp 18   Ht 6' (1.829 m)   Wt 126.6 kg (279 lb)   SpO2 100%   BMI 37.84 kg/m   Labs: No results found for this or any previous visit (from the past 48 hour(s)).  Imaging / Studies: No results found.   Ardeth Sportsman.Teonia Yager C. Natsha Guidry, M.D.,  F.A.C.S. Gastrointestinal and Minimally Invasive Surgery [facility] [address] [place], [place] [address] [phone] Main / Paging  10/14/2017 12:20 PM      Ardeth Sportsman

## 2017-10-14 NOTE — Interval H&P Note (Signed)
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 After consideration of risks, benefits and other options for treatment, the patient has consented to  Procedure(s): XI ROBOTIC ASSISTED REPAIR OF PARAESOPHAGEAL HIATAL HERNIA WITH FUNDOPLICATION (N/A) INSERTION OF MESH (N/A) as a surgical intervention .  The patient's history has been reviewed, patient examined, no change in status, stable for surgery.  I have reviewed the patient's chart and labs.  Questions were answered to the patient's satisfaction.    I have re-reviewed the the patient's records, history, medications, and allergies.  I have re-examined the patient.  I again discussed intraoperative plans and goals of post-operative recovery.  The patient agrees to proceed.  Victor David  Jul 28, 1975 [id number]  Patient Care Team: Burton Apleyoberts, Ronald, MD as PCP - General (Internal Medicine) Karie SodaGross, Violet Cart, MD as Consulting Physician (General Surgery) Marykay LexHarding, David W, MD as Consulting Physician (Cardiology) Graylin ShiverGanem, Salem F, MD as Consulting Physician (Gastroenterology)  Patient Active Problem List   Diagnosis Date Noted  . Hiatal hernia 08/03/2017  . Barrett's esophagus 08/03/2017  . History of Helicobacter pylori infection 08/03/2017  . Pure hypercholesterolemia 08/03/2017  . Obesity (BMI 30-39.9) 08/03/2017  . Colitis 02/09/2016  . Tachycardia 02/09/2016  . Dehydration 02/09/2016  . GERD (gastroesophageal reflux disease) 02/09/2016    Past Medical History:  Diagnosis Date  . Barrett's esophagus   . Chest pain due to GERD   . History of hiatal hernia   . Mild pulmonary hypertension (HCC)     Past Surgical History:  Procedure Laterality Date  . ESOPHAGEAL MANOMETRY N/A 08/30/2017   Procedure: ESOPHAGEAL  MANOMETRY (EM);  Surgeon: Graylin ShiverGanem, Salem F, MD;  Location: [facility];  Service: Endoscopy;  Laterality: N/A;  . KNEE SURGERY Right    meniscus   . TUMOR REMOVAL     from finger   . WISDOM TOOTH EXTRACTION      Social History   Socioeconomic History  . Marital status: Married    Spouse name: Not on file  . Number of children: Not on file  . Years of education: Not on file  . Highest education level: Not on file  Occupational History  . Not on file  Social Needs  . Financial resource strain: Not on file  . Food insecurity:    Worry: Not on file    Inability: Not on file  . Transportation needs:    Medical: Not on file    Non-medical: Not on file  Tobacco Use  . Smoking status: Former Smoker    Packs/day: 1.00    Years: 20.00    Pack years: 20.00    Types: Cigarettes    Last attempt to quit: 10/09/2007    Years since quitting: 10.0  . Smokeless tobacco: Current User    Types: Chew  Substance and Sexual Activity  . Alcohol use: No  . Drug use: Not Currently    Types: Marijuana    Comment: las time was 2 years ago   . Sexual activity: Not on file  Lifestyle  . Physical activity:    Days per week: Not on file    Minutes per session: Not on file  . Stress: Not on file  Relationships  . Social connections:    Talks on phone: Not on file    Gets together:  Not on file    Attends religious service: Not on file    Active member of club or organization: Not on file    Attends meetings of clubs or organizations: Not on file    Relationship status: Not on file  . Intimate partner violence:    Fear of current or ex partner: Not on file    Emotionally abused: Not on file    Physically abused: Not on file    Forced sexual activity: Not on file  Other Topics Concern  . Not on file  Social History Narrative  . Not on file    Family History  Problem Relation Age of Onset  . Heart attack Father   . Heart attack Paternal Grandfather   . High Cholesterol Brother   .  High Cholesterol Brother     Medications Prior to Admission  Medication Sig Dispense Refill Last Dose  . atorvastatin (LIPITOR) 40 MG tablet Take 1 tablet (40 mg total) by mouth daily. (Patient taking differently: Take 40 mg by mouth every evening. ) 90 tablet 3 Taking  . esomeprazole (NEXIUM) 20 MG packet Take 20 mg by mouth 2 (two) times daily.    Past Week at Unknown time  . famotidine (PEPCID AC) 10 MG chewable tablet Chew 10 mg by mouth 2 (two) times daily as needed (stomach pain).   Past Week at Unknown time  . ibuprofen (ADVIL,MOTRIN) 200 MG tablet Take 400 mg by mouth daily as needed for headache, mild pain or moderate pain.    Past Month at Unknown time  . Melatonin 10 MG TABS Take 20 mg by mouth at bedtime.   10/13/2017 at 2100  . oxymetazoline (AFRIN) 0.05 % nasal spray Place 1 spray into both nostrils daily as needed for congestion.   10/14/2017 at Unknown time  . aspirin EC 81 MG tablet Take 1 tablet (81 mg total) by mouth daily. (Patient not taking: Reported on 10/05/2017) 90 tablet 3 Not Taking at Unknown time    Current Facility-Administered Medications  Medication Dose Route Frequency Provider Last Rate Last Dose  . cefTRIAXone (ROCEPHIN) 2 g in sodium chloride 0.9 % 100 mL IVPB  2 g Intravenous On Call to OR Cindi Carbon, [facility]      . dexamethasone (DECADRON) injection 4 mg  4 mg Intravenous On Call to OR Karie Soda, MD      . Melene Muller ON 10/15/2017] feeding supplement (ENSURE PRE-SURGERY) liquid 296 mL  296 mL Oral Once Karie Soda, MD      . feeding supplement (ENSURE PRE-SURGERY) liquid 592 mL  592 mL Oral Once Karie Soda, MD      . lactated ringers infusion   Intravenous Continuous Mal Amabile, MD 10 mL/hr at 10/14/17 1142    . scopolamine (TRANSDERM-SCOP) 1 MG/3DAYS 1.5 mg  1 patch Transdermal On Call to OR Karie Soda, MD   1.5 mg at 10/14/17 1135  . scopolamine (TRANSDERM-SCOP) 1 MG/3DAYS 1.5 mg  1 patch Transdermal Q72H Mal Amabile, MD         No Known  Allergies  BP (!) 132/95   Pulse (!) 106   Temp 98 F (36.7 C) (Oral)   Resp 18   Ht 6' (1.829 m)   Wt 126.6 kg (279 lb)   SpO2 100%   BMI 37.84 kg/m   Labs: No results found for this or any previous visit (from the past 48 hour(s)).  Imaging / Studies: No results found.   Ardeth Sportsman,  M.D., F.A.C.S. Gastrointestinal and Minimally Invasive Surgery [facility] [address] [place], [place] [address] [phone] Main / Paging  10/14/2017 12:32 PM     Ardeth Sportsman

## 2017-10-15 ENCOUNTER — Encounter (HOSPITAL_COMMUNITY): Payer: Self-pay | Admitting: Surgery

## 2017-10-15 ENCOUNTER — Observation Stay (HOSPITAL_COMMUNITY): Payer: BLUE CROSS/BLUE SHIELD

## 2017-10-15 DIAGNOSIS — Z87891 Personal history of nicotine dependence: Secondary | ICD-10-CM | POA: Diagnosis not present

## 2017-10-15 DIAGNOSIS — K449 Diaphragmatic hernia without obstruction or gangrene: Secondary | ICD-10-CM | POA: Diagnosis not present

## 2017-10-15 DIAGNOSIS — K2270 Barrett's esophagus without dysplasia: Secondary | ICD-10-CM | POA: Diagnosis not present

## 2017-10-15 DIAGNOSIS — K210 Gastro-esophageal reflux disease with esophagitis: Secondary | ICD-10-CM | POA: Diagnosis not present

## 2017-10-15 DIAGNOSIS — R1310 Dysphagia, unspecified: Secondary | ICD-10-CM | POA: Diagnosis not present

## 2017-10-15 DIAGNOSIS — Z7982 Long term (current) use of aspirin: Secondary | ICD-10-CM | POA: Diagnosis not present

## 2017-10-15 DIAGNOSIS — Z6837 Body mass index (BMI) 37.0-37.9, adult: Secondary | ICD-10-CM | POA: Diagnosis not present

## 2017-10-15 DIAGNOSIS — E669 Obesity, unspecified: Secondary | ICD-10-CM | POA: Diagnosis not present

## 2017-10-15 DIAGNOSIS — Z79899 Other long term (current) drug therapy: Secondary | ICD-10-CM | POA: Diagnosis not present

## 2017-10-15 DIAGNOSIS — E7800 Pure hypercholesterolemia, unspecified: Secondary | ICD-10-CM | POA: Diagnosis not present

## 2017-10-15 MED ORDER — IOPAMIDOL (ISOVUE-300) INJECTION 61%
INTRAVENOUS | Status: AC
  Administered 2017-10-15: 12:00:00
  Filled 2017-10-15: qty 50

## 2017-10-15 MED ORDER — SODIUM CHLORIDE 0.9% FLUSH
3.0000 mL | Freq: Two times a day (BID) | INTRAVENOUS | Status: DC
  Administered 2017-10-15 – 2017-10-16 (×4): 3 mL via INTRAVENOUS

## 2017-10-15 MED ORDER — METOPROLOL TARTRATE 5 MG/5ML IV SOLN: 5.0000 mg | Freq: Four times a day (QID) | INTRAVENOUS | Status: DC | PRN

## 2017-10-15 MED ORDER — SODIUM CHLORIDE 0.9% FLUSH: 3.0000 mL | INTRAVENOUS | Status: DC | PRN

## 2017-10-15 MED ORDER — HYDRALAZINE HCL 20 MG/ML IJ SOLN: 5.0000 mg | INTRAMUSCULAR | Status: DC | PRN

## 2017-10-15 MED ORDER — SODIUM CHLORIDE 0.9 % IV SOLN: 250.0000 mL | INTRAVENOUS | Status: DC | PRN

## 2017-10-15 MED ORDER — LACTATED RINGERS IV BOLUS: 1000.0000 mL | Freq: Three times a day (TID) | INTRAVENOUS | Status: DC | PRN

## 2017-10-15 MED ORDER — OXYCODONE HCL 5 MG PO TABS
5.0000 mg | ORAL_TABLET | ORAL | Status: DC | PRN
  Administered 2017-10-16 (×2): 5 mg via ORAL
  Filled 2017-10-15 (×2): qty 1

## 2017-10-15 MED ORDER — IOPAMIDOL (ISOVUE-300) INJECTION 61%
50.0000 mL | Freq: Once | INTRAVENOUS | Status: AC | PRN
  Administered 2017-10-15: 50 mL via ORAL

## 2017-10-15 NOTE — Discharge Summary (Addendum)
Physician Discharge Summary    Patient ID: Victor David MRN: [id number] DOB/AGE: January 11, 1976  42 y.o.  Admit date: 10/14/2017 Discharge date: 10/15/2017   Hospital Stay = 0 days  Patient Care Team: Burton Apley, MD as PCP - General (Internal Medicine) Karie Soda, MD as Consulting Physician (General Surgery) Marykay Lex, MD as Consulting Physician (Cardiology) Graylin Shiver, MD as Consulting Physician (Gastroenterology)  Discharge Diagnoses:  Principal Problem:   Hiatal hernia s/p robotic repair & NIssen fundoplication 10/14/2017 Active Problems:   GERD (gastroesophageal reflux disease)   Pure hypercholesterolemia   Obesity (BMI 30-39.9)   1 Day Post-Op  10/14/2017  POST-OPERATIVE DIAGNOSIS:   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  Postoperatively, the patient gradually mobilized and tolerating liquid diet.  Esophagram showed no leak or obstruction.  Pain and other symptoms were treated aggressively.    By the time of discharge, the patient was walking well the hallways, drinking liquids, having flatus.  Pain was well-controlled on an oral medications.  Based on meeting discharge criteria and continuing to recover, I felt it was safe for the patient to be discharged from the hospital to further recover with close followup. Postoperative recommendations were discussed in detail.  They are written as well.  Discharged Condition: good  Disposition:  Follow-up Information    Schedule an appointment as soon as possible for a visit with Karie Soda, MD.   Specialty:  General Surgery Why:  To follow up after your operation, To follow up after your hospital  stay Contact information: [address] [place] [address] [phone]           Discharge disposition: 01-Home or Self Care       Discharge Instructions    Call MD for:   Complete by:  As directed    FEVER > 101.5 F  (temperatures < 101.5 F are not significant)   Call MD for:  extreme fatigue   Complete by:  As directed    Call MD for:  persistant dizziness or light-headedness   Complete by:  As directed    Call MD for:  persistant nausea and vomiting   Complete by:  As directed    Call MD for:  redness, tenderness, or signs of infection (pain, swelling, redness, odor or green/yellow discharge around incision site)   Complete by:  As directed    Call MD for:  severe uncontrolled pain   Complete by:  As directed    Diet - low sodium heart healthy   Complete by:  As directed    Start with a bland diet such as soups, liquids, starchy foods, low fat foods, etc. the first few days at home. Gradually advance to a solid, low-fat, high fiber diet by the end of the first week at home.   Add a fiber supplement to your diet (Metamucil, etc) If you feel full, bloated, or constipated, stay on a full liquid or pureed/blenderized diet for a few days until you feel better and are no longer constipated.   Discharge instructions   Complete by:  As directed    See Discharge Instructions If you are not getting better after two weeks or are noticing you are getting  worse, contact our office [phone] for further advice.  We may need to adjust your medications, re-evaluate you in the office, send you to the emergency room, or see what other things we can do to help. The clinic staff is available to answer your questions during regular business hours (8:30am-5pm).  Please don't hesitate to call and ask to speak to one of our nurses for clinical concerns.    A surgeon from [facility] is always on call at the hospitals 24 hours/day If you have a medical  emergency, go to the nearest emergency room or call 911.   Discharge wound care:   Complete by:  As directed    It is good for closed incisions to be washed every day.  Shower every day.  Short baths are fine.  Wash the incisions and wounds clean with soap & water.   Remove dressings POD#5 = next Tuesday 7/30  You may leave closed incisions open to air if it is dry.   You may cover the incision with clean gauze & replace it after your daily shower for comfort.   Driving Restrictions   Complete by:  As directed    You may drive when: - you are no longer taking narcotic prescription pain medication - you can comfortably wear a seatbelt - you can safely make sudden turns/stops without pain.   Increase activity slowly   Complete by:  As directed    Start light daily activities --- self-care, walking, climbing stairs- beginning the day after surgery.  Gradually increase activities as tolerated.  Control your pain to be active.  Stop when you are tired.  Ideally, walk several times a day, eventually an hour a day.   Most people are back to most day-to-day activities in a few weeks.  It takes 4-6 weeks to get back to unrestricted, intense activity. If you can walk 30 minutes without difficulty, it is safe to try more intense activity such as jogging, treadmill, bicycling, low-impact aerobics, swimming, etc. Save the most intensive and strenuous activity for last (Usually 4-8 weeks after surgery) such as sit-ups, heavy lifting, contact sports, etc.  Refrain from any intense heavy lifting or straining until you are off narcotics for pain control.  You will have off days, but things should improve week-by-week. DO NOT PUSH THROUGH PAIN.  Let pain be your guide: If it hurts to do something, don't do it.   Lifting restrictions   Complete by:  As directed    If you can walk 30 minutes without difficulty, it is safe to try more intense activity such as jogging, treadmill, bicycling, low-impact aerobics,  swimming, etc. Save the most intensive and strenuous activity for last (Usually 4-8 weeks after surgery) such as sit-ups, heavy lifting, contact sports, etc.   Refrain from any intense heavy lifting or straining until you are off narcotics for pain control.  You will have off days, but things should improve week-by-week. DO NOT PUSH THROUGH PAIN.  Let pain be your guide: If it hurts to do something, don't do it.  Pain is your body warning you to avoid that activity for another week until the pain goes down.   May shower / Bathe   Complete by:  As directed    May walk up steps   Complete by:  As directed    Sexual Activity Restrictions   Complete by:  As directed    You may have sexual intercourse when it is comfortable. If it  hurts to do something, stop.      Allergies as of 10/15/2017   No Known Allergies     Medication List    TAKE these medications   aspirin EC 81 MG tablet Take 1 tablet (81 mg total) by mouth daily.   atorvastatin 40 MG tablet Commonly known as:  LIPITOR Take 1 tablet (40 mg total) by mouth daily. What changed:  when to take this   esomeprazole 20 MG packet Commonly known as:  NEXIUM Take 20 mg by mouth 2 (two) times daily.   famotidine 10 MG chewable tablet Commonly known as:  PEPCID AC Chew 10 mg by mouth 2 (two) times daily as needed (stomach pain).   ibuprofen 200 MG tablet Commonly known as:  ADVIL,MOTRIN Take 400 mg by mouth daily as needed for headache, mild pain or moderate pain.   Melatonin 10 MG Tabs Take 20 mg by mouth at bedtime.   ondansetron 4 MG tablet Commonly known as:  ZOFRAN Take 1 tablet (4 mg total) by mouth every 8 (eight) hours as needed for nausea.   oxymetazoline 0.05 % nasal spray Commonly known as:  AFRIN Place 1 spray into both nostrils daily as needed for congestion.   promethazine 25 MG suppository Commonly known as:  PHENERGAN Place 1 suppository (25 mg total) rectally every 8 (eight) hours as needed for nausea  or vomiting.   traMADol 50 MG tablet Commonly known as:  ULTRAM Take 1-2 tablets (50-100 mg total) by mouth every 6 (six) hours as needed for moderate pain or severe pain.            Discharge Care Instructions  (From admission, onward)        Start     Ordered   10/15/17 0000  Discharge wound care:    Comments:  It is good for closed incisions and even open wounds to be washed every day.  Shower every day.  Short baths are fine.  Wash the incisions and wounds clean with soap & water.     You may leave closed incisions open to air if it is dry.   You may cover the incision with clean gauze & replace it after your daily shower for comfort.   If you have skin tapes (Steristrips) or skin glue (Dermabond) on your incision, leave them in place.  They will fall off on their own like a scab.  You may trim any edges that curl up with clean scissors.    If you have skin staples, set up an appointment for them to be removed in the office in 10 days after surgery.  If you have a drain, wash around the skin exit site with soap & water and place a new dressing of gauze or band aid around the skin every day.  Keep the drain site clean & dry.   10/15/17 1121      Significant Diagnostic Studies:  No results found for this or any previous visit (from the past 72 hour(s)).  No results found.  Discharge Exam: Blood pressure 116/88, pulse (!) 110, temperature 98.5 F (36.9 C), temperature source Oral, resp. rate 16, height 6' (1.829 m), weight 126.6 kg (279 lb), SpO2 99 %.  General: Pt awake/alert/oriented x4 in No acute distress Eyes: PERRL, normal EOM.  Sclera clear.  No icterus Neuro: CN II-XII intact w/o focal sensory/motor deficits. Lymph: No head/neck/groin lymphadenopathy Psych:  No delerium/psychosis/paranoia HENT: Normocephalic, Mucus membranes moist.  No thrush Neck: Supple, No tracheal deviation  Chest: No chest wall pain w good excursion CV:  Pulses intact.  Regular rhythm MS:  Normal AROM mjr joints.  No obvious deformity Abdomen: Soft.  Nondistended.  Nontender.  No evidence of peritonitis.  No incarcerated hernias. Ext:  SCDs BLE.  No mjr edema.  No cyanosis Skin: No petechiae / purpura  Past Medical History:  Diagnosis Date  . Barrett's esophagus   . Chest pain due to GERD   . History of hiatal hernia   . Mild pulmonary hypertension (HCC)     Past Surgical History:  Procedure Laterality Date  . ESOPHAGEAL MANOMETRY N/A 08/30/2017   Procedure: ESOPHAGEAL MANOMETRY (EM);  Surgeon: Graylin ShiverGanem, Salem F, MD;  Location: [facility];  Service: Endoscopy;  Laterality: N/A;  . INSERTION OF MESH N/A 10/14/2017   Procedure: INSERTION OF MESH;  Surgeon: Karie SodaGross, Huey Scalia, MD;  Location: [facility];  Service: General;  Laterality: N/A;  . KNEE SURGERY Right    meniscus   . TUMOR REMOVAL     from finger   . WISDOM TOOTH EXTRACTION      Social History   Socioeconomic History  . Marital status: Married    Spouse name: Not on file  . Number of children: Not on file  . Years of education: Not on file  . Highest education level: Not on file  Occupational History  . Not on file  Social Needs  . Financial resource strain: Not on file  . Food insecurity:    Worry: Not on file    Inability: Not on file  . Transportation needs:    Medical: Not on file    Non-medical: Not on file  Tobacco Use  . Smoking status: Former Smoker    Packs/day: 1.00    Years: 20.00    Pack years: 20.00    Types: Cigarettes    Last attempt to quit: 10/09/2007    Years since quitting: 10.0  . Smokeless tobacco: Current User    Types: Chew  Substance and Sexual Activity  . Alcohol use: No  . Drug use: Not Currently    Types: Marijuana    Comment: las time was 2 years ago   . Sexual activity: Not on file  Lifestyle  . Physical activity:    Days per week: Not on file    Minutes per session: Not on file  . Stress: Not on file  Relationships  . Social connections:    Talks on phone: Not  on file    Gets together: Not on file    Attends religious service: Not on file    Active member of club or organization: Not on file    Attends meetings of clubs or organizations: Not on file    Relationship status: Not on file  . Intimate partner violence:    Fear of current or ex partner: Not on file    Emotionally abused: Not on file    Physically abused: Not on file    Forced sexual activity: Not on file  Other Topics Concern  . Not on file  Social History Narrative  . Not on file    Family History  Problem Relation Age of Onset  . Heart attack Father   . Heart attack Paternal Grandfather   . High Cholesterol Brother   . High Cholesterol Brother     Current Facility-Administered Medications  Medication Dose Route Frequency Provider Last Rate Last Dose  . 0.9 %  sodium chloride infusion  250 mL Intravenous PRN  Karie Soda, MD      . 0.9 %  sodium chloride infusion  250 mL Intravenous PRN Karie Soda, MD 10 mL/hr at 10/15/17 0818    . acetaminophen (TYLENOL) tablet 1,000 mg  1,000 mg Oral Trixie Deis, MD   1,000 mg at 10/15/17 0539  . atorvastatin (LIPITOR) tablet 40 mg  40 mg Oral QPM Karie Soda, MD   40 mg at 10/14/17 1930  . bisacodyl (DULCOLAX) suppository 10 mg  10 mg Rectal Daily PRN Karie Soda, MD      . diphenhydrAMINE (BENADRYL) 12.5 MG/5ML elixir 12.5 mg  12.5 mg Oral Q6H PRN Karie Soda, MD       Or  . diphenhydrAMINE (BENADRYL) injection 12.5 mg  12.5 mg Intravenous Q6H PRN Karie Soda, MD      . enalaprilat (VASOTEC) injection 0.625-1.25 mg  0.625-1.25 mg Intravenous Q6H PRN Karie Soda, MD      . enoxaparin (LOVENOX) injection 40 mg  40 mg Subcutaneous Q24H Karie Soda, MD   40 mg at 10/15/17 0750  . esomeprazole (NEXIUM) capsule 20 mg  20 mg Oral BID Karie Soda, MD   20 mg at 10/15/17 1009  . famotidine (PEPCID) tablet 10 mg  10 mg Oral BID PRN Karie Soda, MD      . feeding supplement (ENSURE SURGERY) liquid 237 mL  237 mL Oral  BID BM Karie Soda, MD      . gabapentin (NEURONTIN) capsule 300 mg  300 mg Oral BID Karie Soda, MD   300 mg at 10/15/17 1007  . hydrALAZINE (APRESOLINE) injection 5-20 mg  5-20 mg Intravenous Q4H PRN Karie Soda, MD      . HYDROmorphone (DILAUDID) injection 0.5-2 mg  0.5-2 mg Intravenous Q2H PRN Karie Soda, MD   1 mg at 10/15/17 0141  . ibuprofen (ADVIL,MOTRIN) tablet 400 mg  400 mg Oral Daily PRN Karie Soda, MD      . lactated ringers bolus 1,000 mL  1,000 mL Intravenous TID PRN Karie Soda, MD      . lactated ringers infusion 1,000 mL  1,000 mL Intravenous Q8H PRN Genice Kimberlin, Viviann Spare, MD      . lip balm (CARMEX) ointment 1 application  1 application Topical BID Karie Soda, MD   1 application at 10/15/17 1009  . magic mouthwash  15 mL Oral QID PRN Karie Soda, MD      . Melatonin TABS 20 mg  20 mg Oral Laurena Slimmer, MD   20 mg at 10/14/17 2150  . methocarbamol (ROBAXIN) 1,000 mg in dextrose 5 % 50 mL IVPB  1,000 mg Intravenous Q6H PRN Karie Soda, MD 120 mL/hr at 10/14/17 1643 1,000 mg at 10/14/17 1643  . methocarbamol (ROBAXIN) tablet 750 mg  750 mg Oral Q6H PRN Karie Soda, MD      . metoCLOPramide (REGLAN) injection 5-10 mg  5-10 mg Intravenous Q8H PRN Karie Soda, MD      . metoprolol tartrate (LOPRESSOR) injection 5 mg  5 mg Intravenous Q6H PRN Karie Soda, MD      . ondansetron (ZOFRAN-ODT) disintegrating tablet 4 mg  4 mg Oral Q6H PRN Karie Soda, MD       Or  . ondansetron [facility]) injection 4 mg  4 mg Intravenous Q6H PRN Karie Soda, MD      . oxyCODONE (Oxy IR/ROXICODONE) immediate release tablet 5-10 mg  5-10 mg Oral Q4H PRN Absher, Ky Barban, RPH      . oxymetazoline (AFRIN) 0.05 % nasal  spray 1 spray  1 spray Each Nare Daily PRN Karie Soda, MD      . polyethylene glycol (MIRALAX / GLYCOLAX) packet 17 g  17 g Oral Daily PRN Karie Soda, MD      . prochlorperazine (COMPAZINE) tablet 10 mg  10 mg Oral Q6H PRN Karie Soda, MD       Or  .  prochlorperazine (COMPAZINE) injection 5-10 mg  5-10 mg Intravenous Q6H PRN Karie Soda, MD      . simethicone (MYLICON) chewable tablet 40 mg  40 mg Oral QID Karie Soda, MD   40 mg at 10/15/17 1007  . sodium chloride flush (NS) 0.9 % injection 3 mL  3 mL Intravenous Catha Gosselin, MD      . sodium chloride flush (NS) 0.9 % injection 3 mL  3 mL Intravenous PRN Karie Soda, MD      . sodium chloride flush (NS) 0.9 % injection 3 mL  3 mL Intravenous Catha Gosselin, MD   3 mL at 10/15/17 1010  . sodium chloride flush (NS) 0.9 % injection 3 mL  3 mL Intravenous PRN Karie Soda, MD         No Known Allergies  Signed: Lorenso Courier, MD, FACS, MASCRS Gastrointestinal and Minimally Invasive Surgery    [address], [address] [place], [place] [address] [phone] Main / Paging [phone] Fax   10/15/2017, 11:22 AM

## 2017-10-15 NOTE — Progress Notes (Signed)
Patient reports "I don't feel good. I had some nausea and pain after eating, along with the hiccoughs". Patient placed back to NPO except sips and chips and Dr Michaell CowingGross notified. Lina SarBeth Japhet Morgenthaler, RN

## 2017-10-15 NOTE — Progress Notes (Signed)
Encouraged patient to ambulate in halls, as he has been walking in his room. Patient doesn't want to venture far from his bathroom, so recommended he walk hall in one half laps in order to stay close to his room. Lina SarBeth Miyah Hampshire, RN

## 2017-10-15 NOTE — Progress Notes (Signed)
Victor David [id number] 1976-01-10  CARE TEAM:  PCP: Burton Apley, MD  Outpatient Care Team: Patient Care Team: Burton Apley, MD as PCP - General (Internal Medicine) Karie Soda, MD as Consulting Physician (General Surgery) Marykay Lex, MD as Consulting Physician (Cardiology) Graylin Shiver, MD as Consulting Physician (Gastroenterology)  Inpatient Treatment Team: Treatment Team: Attending Provider: Karie Soda, MD; Registered Nurse: Hilliard Clark, RN; Registered Nurse: Nehemiah Settle, RN; Technician: Genoveva Ill, [place]   Problem List:   Principal Problem:   Hiatal hernia s/p robotic repair & NIssen fundoplication 10/14/2017 Active Problems:   GERD (gastroesophageal reflux disease)   Pure hypercholesterolemia   Obesity (BMI 30-39.9)   1 Day Post-Op  10/14/2017  POST-OPERATIVE DIAGNOSIS:  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 ROBOTIC reduction of paraesophageal hiatal hernia 2. Type II mediastinal dissection. 3. Primary repair of hiatal hernia over pledgets.  4. Anterior & posterior gastropexy. 5. Nissen fundoplication 2 cm over a 56-French bougie 6. Mesh reinforcement with absorbable mesh  SURGEON:  Ardeth Sportsman, MD    Hospital Stay = 0 days  Assessment  Recovering  Plan:  -Esophagogram this AM - if no leak/obstruction, adv diet to pureed.    -Diet education.  Patient will need blenderized/pured diet for the first few weeks until edema goes down.  -Hold IV fluids with backup as needed boluses to avoid wrap edema. -Continue proton pump inhibitor for now perioperatively.  Hopefully can wean off in a month or 2 -Control hypercholesterolemia. -VTE prophylaxis- SCDs, etc -mobilize as tolerated to help recovery  D/C patient from hospital when patient meets criteria (anticipate later today):  Tolerating oral intake well Ambulating well Adequate pain control without IV  medications Urinating  Having flatus Disposition planning in place   40 minutes spent in review, evaluation, examination, counseling, and coordination of care.  More than 50% of that time was spent in counseling.  10/15/2017    Subjective: (Chief complaint)  No major events Keeping some liquids down.  Mild dysphasia if he drinks too quickly.   Walking a lot in the hallways. Able to burp Mild discomfort on left shoulder.  Getting better.  Objective:  Vital signs:  Vitals:   10/14/17 2018 10/14/17 2132 10/15/17 0151 10/15/17 0437  BP: 127/87 126/85 114/74 124/86  Pulse: (!) 108 (!) 108 (!) 108 (!) 105  Resp: 18 20 18 17   Temp: 98.3 F (36.8 C) 98 F (36.7 C) (!) 97.4 F (36.3 C) 97.6 F (36.4 C)  TempSrc: Oral Oral Oral Oral  SpO2: 96% 92% 93% 95%  Weight:      Height:        Last BM Date: 10/13/17  Intake/Output   Yesterday:  07/25 0701 - 07/26 0700 In: 3467.7 [P.O.:240; I.V.:2493.7; IV Piggyback:734] Out: 430 [Urine:430] This shift:  No intake/output data recorded.  Bowel function:  Flatus: YES  BM:  No  Drain: (No drain)   Physical Exam:  General: Pt awake/alert/oriented x4 in no acute distress Eyes: PERRL, normal EOM.  Sclera clear.  No icterus Neuro: CN II-XII intact w/o focal sensory/motor deficits. Lymph: No head/neck/groin lymphadenopathy Psych:  No delerium/psychosis/paranoia HENT: Normocephalic, Mucus membranes moist.  No thrush Neck: Supple, No tracheal deviation Chest: No chest wall pain w good excursion CV:  Pulses intact.  Regular rhythm MS: Normal AROM mjr joints.  No obvious deformity  Abdomen: Soft.  Nondistended.  Nontender.  No evidence of peritonitis.  No incarcerated hernias.  Ext:   No deformity.  No mjr edema.  No cyanosis Skin: No petechiae / purpura  Results:   Labs: No results found for this or any previous visit (from the past 48 hour(s)).  Imaging / Studies: No results found.  Medications / Allergies: per  chart  Antibiotics: Anti-infectives (From admission, onward)   Start     Dose/Rate Route Frequency Ordered Stop   10/14/17 1115  cefTRIAXone (ROCEPHIN) 2 g in sodium chloride 0.9 % 100 mL IVPB     2 g 200 mL/hr over 30 Minutes Intravenous On call to O.R. 10/14/17 1104 10/14/17 1353        Note: Portions of this report may have been transcribed using voice recognition software. Every effort was made to ensure accuracy; however, inadvertent computerized transcription errors may be present.   Any transcriptional errors that result from this process are unintentional.     Ardeth SportsmanSteven C. Keil Pickering, MD, FACS, MASCRS Gastrointestinal and Minimally Invasive Surgery    [address] [place], [place] [id number] [phone] Main / Paging [phone] Fax

## 2017-10-16 DIAGNOSIS — Z6837 Body mass index (BMI) 37.0-37.9, adult: Secondary | ICD-10-CM | POA: Diagnosis not present

## 2017-10-16 DIAGNOSIS — Z87891 Personal history of nicotine dependence: Secondary | ICD-10-CM | POA: Diagnosis not present

## 2017-10-16 DIAGNOSIS — K210 Gastro-esophageal reflux disease with esophagitis: Secondary | ICD-10-CM | POA: Diagnosis not present

## 2017-10-16 DIAGNOSIS — Z79899 Other long term (current) drug therapy: Secondary | ICD-10-CM | POA: Diagnosis not present

## 2017-10-16 DIAGNOSIS — E7800 Pure hypercholesterolemia, unspecified: Secondary | ICD-10-CM | POA: Diagnosis not present

## 2017-10-16 DIAGNOSIS — Z7982 Long term (current) use of aspirin: Secondary | ICD-10-CM | POA: Diagnosis not present

## 2017-10-16 DIAGNOSIS — E669 Obesity, unspecified: Secondary | ICD-10-CM | POA: Diagnosis not present

## 2017-10-16 DIAGNOSIS — K2270 Barrett's esophagus without dysplasia: Secondary | ICD-10-CM | POA: Diagnosis not present

## 2017-10-16 DIAGNOSIS — K449 Diaphragmatic hernia without obstruction or gangrene: Secondary | ICD-10-CM | POA: Diagnosis not present

## 2017-10-16 NOTE — Progress Notes (Signed)
2 Days Post-Op   Subjective/Chief Complaint: Pt doing well this AM Some mid chest pain when taking some liquids   Objective: Vital signs in last 24 hours: Temp:  [97.7 F (36.5 C)-98.7 F (37.1 C)] 98 F (36.7 C) (07/27 0604) Pulse Rate:  [102-112] 103 (07/27 0604) Resp:  [14-18] 18 (07/27 0604) BP: (116-141)/(73-95) 132/73 (07/27 0604) SpO2:  [98 %-99 %] 98 % (07/27 0604) Last BM Date: 10/14/17  Intake/Output from previous day: 07/26 0701 - 07/27 0700 In: 646.3 [P.O.:600; I.V.:46.3] Out: 2300 [Urine:2300] Intake/Output this shift: No intake/output data recorded.  General appearance: alert and cooperative GI: soft, non-tender; bowel sounds normal; no masses,  no organomegaly and inc c/d/i  Lab Results:  No results for input(s): WBC, HGB, HCT, PLT in the last 72 hours. BMET No results for input(s): NA, K, CL, CO2, GLUCOSE, BUN, CREATININE, CALCIUM in the last 72 hours. PT/INR No results for input(s): LABPROT, INR in the last 72 hours. ABG No results for input(s): PHART, HCO3 in the last 72 hours.  Invalid input(s): PCO2, PO2  Studies/Results: Dg Esophagus W/water Sol Cm  Result Date: 10/15/2017 CLINICAL DATA:  Status post fundoplication. EXAM: ESOPHOGRAM/BARIUM SWALLOW TECHNIQUE: Single contrast examination was performed using water-soluble contrast. FLUOROSCOPY TIME:  Fluoroscopy Time:  1 minutes Radiation Exposure Index (if provided by the fluoroscopic device): 499.5 micro Gy per meter squared Number of Acquired Spot Images: 6 COMPARISON:  None. FINDINGS: There is smooth narrowing of the distal esophagus across the gastroesophageal junction consistent with the fundoplication performed yesterday. There is no contrast extravasation to suggest a postoperative leak. Contrast extends through the distal esophagus into the stomach. IMPRESSION: 1. Smooth narrowing of the distal esophagus across the gastroesophageal junction consistent with the fundoplication performed yesterday.  No contrast extravasation to suggest a postoperative leak. No obstruction. Electronically Signed   By: Amie Portlandavid  Ormond M.D.   On: 10/15/2017 12:17    Anti-infectives: Anti-infectives (From admission, onward)   Start     Dose/Rate Route Frequency Ordered Stop   10/14/17 1115  cefTRIAXone (ROCEPHIN) 2 g in sodium chloride 0.9 % 100 mL IVPB     2 g 200 mL/hr over 30 Minutes Intravenous On call to O.R. 10/14/17 1104 10/14/17 1353      Assessment/Plan: s/p Procedure(s): XI ROBOTIC ASSISTED REPAIR OF PARAESOPHAGEAL HIATAL HERNIA WITH FUNDOPLICATION (N/A) INSERTION OF MESH (N/A) Restart CLD this AM Mobilize Hopefully home in AM  LOS: 0 days    Marigene Ehlersamirez Jr., Jed LimerickArmando 10/16/2017

## 2017-10-16 NOTE — Discharge Instructions (Signed)
EATING AFTER YOUR ESOPHAGEAL SURGERY (Stomach Fundoplication, Hiatal Hernia repair, Achalasia surgery, etc)  ######################################################################  EATafter discharge.    WALK Walk an hour a day.  Control your pain to do that.    Start with a pureed / full liquid diet (see below) Gradually transition to a high fiber diet with a fiber supplement over the next month  CONTROL PAIN Control pain so that you can walk, sleep, tolerate sneezing/coughing, go up/down stairs.  HAVE A BOWEL MOVEMENT DAILY Keep your bowels regular to avoid problems.  OK to try a laxative to override constipation.  OK to use an antidairrheal to slow down diarrhea.  Call if not better after 2 tries  CALL IF YOU HAVE PROBLEMS/CONCERNS Call if you are still struggling despite following these instructions. Call if you have concerns not answered by these instructions  ######################################################################   After your esophageal surgery, expect some sticking with swallowing over the next 1-2 months.    If food sticks when you eat, it is called "dysphagia".  This is due to swelling around your esophagus at the wrap & hiatal diaphragm repair.  It will gradually ease off over the next few months.  To help you through this temporary phase, we start you out on a pureed (blenderized) diet.  Your first meal in the hospital was thin liquids.  You should have been given a pureed diet by the time you left the hospital.  We ask patients to stay on a pureed diet for the first 2-3 weeks to avoid anything getting "stuck" near your recent surgery.  Don't be alarmed if your ability to swallow doesn't progress according to this plan.  Everyone is different and some diets can advance more or less quickly.     Some BASIC RULES to follow are:  Maintain an upright position whenever eating or drinking.  Take small bites - just a teaspoon size bite at a time.  Eat slowly.   It may also help to eat only one food at a time.  Consider nibbling through smaller, more frequent meals & avoid the urge to eat BIG meals  Do not push through feelings of fullness, nausea, or bloatedness  Do not mix solid foods and liquids in the same mouthful  Try not to "wash foods down" with large gulps of liquids.  Avoid carbonated (bubbly/fizzy) drinks.    Avoid foods that make you feel gassy or bloated.  Start with bland foods first.  Wait on trying greasy, fried, or spicy meals until you are tolerating more bland solids well.  Understand that it will be hard to burp and belch at first.  This gradually improves with time.  Expect to be more gassy/flatulent/bloated initially.  Walking will help your body manage it better.  Consider using medications for bloating that contain simethicone such as  Maalox or Gas-X   Eat in a relaxed atmosphere & minimize distractions.  Avoid talking while eating.    Do not use straws.  Following each meal, sit in an upright position (90 degree angle) for 60 to 90 minutes.  Going for a short walk can help as well  If food does stick, don't panic.  Try to relax and let the food pass on its own.  Sipping WARM LIQUID such as strong hot black tea can also help slide it down.   Be gradual in changes & use common sense:  -If you easily tolerating a certain "level" of foods, advance to the next level gradually -If you are  having trouble swallowing a particular food, then avoid it.   -If food is sticking when you advance your diet, go back to thinner previous diet (the lower LEVEL) for 1-2 days.  LEVEL 1 = PUREED DIET  Do for the first 2 WEEKS AFTER SURGERY  -Foods in this group are pureed or blenderized to a smooth, mashed potato-like consistency.  -If necessary, the pureed foods can keep their shape with the addition of a thickening agent.   -Meat should be pureed to a smooth, pasty consistency.  Hot broth or gravy may be added to the pureed  meat, approximately 1 oz. of liquid per 3 oz. serving of meat. -CAUTION:  If any foods do not puree into a smooth consistency, swallowing will be more difficult.  (For example, nuts or seeds sometimes do not blend well.)  Hot Foods Cold Foods  Pureed scrambled eggs and cheese Pureed cottage cheese  Baby cereals Thickened juices and nectars  Thinned cooked cereals (no lumps) Thickened milk or eggnog  Pureed [place] toast or pancakes Ensure  Mashed potatoes Ice cream  Pureed parsley, au gratin, scalloped potatoes, candied sweet potatoes Fruit or [place] ice, sherbet  Pureed buttered or alfredo noodles Plain yogurt  Pureed vegetables (no corn or peas) Instant breakfast  Pureed soups and creamed soups Smooth pudding, mousse, custard  Pureed scalloped apples Whipped gelatin  Gravies Sugar, syrup, honey, jelly  Sauces, cheese, tomato, barbecue, Scotty Weigelt, creamed Cream  Any baby food Creamer  Alcohol in moderation (not beer or champagne) Margarine  Coffee or tea Mayonnaise   Ketchup, mustard   Apple sauce   SAMPLE MENU:  PUREED DIET Breakfast Lunch Dinner   Orange juice, 1/2 cup  Cream of wheat, 1/2 cup  Pineapple juice, 1/2 cup  Pureed [place], barley soup, 3/4 cup  Pureed Hawaiian chicken, 3 oz   Scrambled eggs, mashed or blended with cheese, 1/2 cup  Tea or coffee, 1 cup   Whole milk, 1 cup   Non-dairy creamer, 2 Tbsp.  Mashed potatoes, 1/2 cup  Pureed cooled broccoli, 1/2 cup  Apple sauce, 1/2 cup  Coffee or tea  Mashed potatoes, 1/2 cup  Pureed spinach, 1/2 cup  Frozen yogurt, 1/2 cup  Tea or coffee      LEVEL 2 = SOFT DIET  After your first 2 weeks, you can advance to a soft diet.   Keep on this diet until everything goes down easily.  Hot Foods Cold Foods  [place] Mas[place] fish Cottage cheese  Stuffed fish Junior baby fruit  Baby food meals Semi thickened juices  Minced soft cooked, scrambled, poached eggs nectars  Souffle & omelets Ripe mashed bananas  Cooked  cereals Canned fruit, pineapple sauce, milk  potatoes Milkshake  Buttered or Alfredo noodles Custard  Cooked cooled vegetable Puddings, including tapioca  Sherbet Yogurt  Vegetable soup or alphabet soup Fruit ice, [place] ice  Gravies Whipped gelatin  Sugar, syrup, honey, jelly Junior baby desserts  Sauces:  Cheese, creamed, barbecue, tomato, Lear Carstens Cream  Coffee or tea Margarine   SAMPLE MENU:  LEVEL 2 Breakfast Lunch Dinner   Orange juice, 1/2 cup  Oatmeal, 1/2 cup  Scrambled eggs with cheese, 1/2 cup  Decaffeinated tea, 1 cup  Whole milk, 1 cup  Non-dairy creamer, 2 Tbsp  Pineapple juice, 1/2 cup  Minced beef, 3 oz  Gravy, 2 Tbsp  Mashed potatoes, 1/2 cup  Minced fresh broccoli, 1/2 cup  Applesauce, 1/2 cup  Coffee, 1 cup  [place], barley soup, 3/4 cup  Minced Hawaiian chicken, 3 oz °· Mashed potatoes, 1/2 cup °· Cooked spinach, 1/2 cup °· Frozen yogurt, 1/2 cup °· Non-dairy creamer, 2 Tbsp  ° ° ° ° °LEVEL 3 = CHOPPED DIET ° °-After all the foods in level 2 (soft diet) are passing through well you should advance up to more chopped foods.  °-It is still important to cut these foods into small pieces and eat slowly. ° °Hot Foods Cold Foods  °Poultry Cottage cheese  °Chopped Swedish meatballs Yogurt  °Meat salads (ground or flaked meat) Milk  °Flaked fish (tuna) Milkshakes  °Poached or scrambled eggs Soft, cold, dry cereal  °Souffles and omelets Fruit juices or nectars  °Cooked cereals Chopped canned fruit  °Chopped French toast or pancakes Canned fruit cocktail  °Noodles or pasta (no rice) Pudding, mousse, custard  °Cooked vegetables (no frozen peas, corn, or mixed vegetables) Green salad  °Canned small sweet peas Ice cream  °Creamed soup or vegetable soup Fruit ice, Italian ice  °Pureed vegetable soup or alphabet soup Non-dairy creamer  °Ground scalloped apples Margarine  °Gravies Mayonnaise  °Sauces:  Cheese, creamed, barbecue, tomato, Draiden Mirsky Ketchup  °Coffee or tea Mustard   ° °SAMPLE MENU:  LEVEL 3 °Breakfast Lunch Dinner  °· Orange juice, 1/2 cup °· Oatmeal, 1/2 cup °· Scrambled eggs with cheese, 1/2 cup °· Decaffeinated tea, 1 cup °· Whole milk, 1 cup °· Non-dairy creamer, 2 Tbsp °· Ketchup, 1 Tbsp °· Margarine, 1 tsp °· Salt, 1/4 tsp °· Sugar, 2 tsp · Pineapple juice, 1/2 cup °· Ground beef, 3 oz °· Gravy, 2 Tbsp °· Mashed potatoes, 1/2 cup °· Cooked spinach, 1/2 cup °· Applesauce, 1/2 cup °· Decaffeinated coffee °· Whole milk °· Non-dairy creamer, 2 Tbsp °· Margarine, 1 tsp °· Salt, 1/4 tsp · Pureed turkey, barley soup, 3/4 cup °· Barbecue chicken, 3 oz °· Mashed potatoes, 1/2 cup °· Ground fresh broccoli, 1/2 cup °· Frozen yogurt, 1/2 cup °· Decaffeinated tea, 1 cup °· Non-dairy creamer, 2 Tbsp °· Margarine, 1 tsp °· Salt, 1/4 tsp °· Sugar, 1 tsp  ° ° °LEVEL 4:  REGULAR FOODS ° °-Foods in this group are soft, moist, regularly textured foods.   °-This level includes meat and breads, which tend to be the hardest things to swallow.   °-Eat very slowly, chew well and continue to avoid carbonated drinks. °-most people are at this level in 4-6 weeks ° °Hot Foods Cold Foods  °Baked fish or skinned Soft cheeses - cottage cheese  °Souffles and omelets Cream cheese  °Eggs Yogurt  °Stuffed shells Milk  °Spaghetti with meat sauce Milkshakes  °Cooked cereal Cold dry cereals (no nuts, dried fruit, coconut)  °French toast or pancakes Crackers  °Buttered toast Fruit juices or nectars  °Noodles or pasta (no rice) Canned fruit  °Potatoes (all types) Ripe bananas  °Soft, cooked vegetables (no corn, lima, or baked beans) Peeled, ripe, fresh fruit  °Creamed soups or vegetable soup Cakes (no nuts, dried fruit, coconut)  °Canned chicken noodle soup Plain doughnuts  °Gravies Ice cream  °Bacon dressing Pudding, mousse, custard  °Sauces:  Cheese, creamed, barbecue, tomato, Zailynn Brandel Fruit ice, Italian ice, sherbet  °Decaffeinated tea or coffee Whipped gelatin  °Pork chops Regular gelatin  ° Canned fruited  gelatin molds  ° Sugar, syrup, honey, jam, jelly  ° Cream  ° Non-dairy  ° Margarine  ° Oil  ° Mayonnaise  ° Ketchup  ° Mustard  ° °TROUBLESHOOTING IRREGULAR BOWELS  °1) Avoid extremes of bowel   movements (no bad constipation/diarrhea)  °2) Miralax 17gm mixed in 8oz. water or juice-daily. May use BID as needed.  °3) Gas-x,Phazyme, etc. as needed for gas & bloating.  °4) Soft,bland diet. No spicy,greasy,fried foods.  °5) Prilosec over-the-counter as needed  °6) May hold gluten/wheat products from diet to see if symptoms improve.  °7) May try probiotics (Align, Activa, etc) to help calm the bowels down  °7) If symptoms become worse call back immediately. ° ° ° °If you have any questions please call our office at [facility]: [phone]. ° °LAPAROSCOPIC SURGERY: POST OP INSTRUCTIONS ° °###################################################################### ° °EAT °Gradually transition to a high fiber diet with a fiber supplement over the next few weeks after discharge.  Start with a pureed / full liquid diet (see below) ° °WALK °Walk an hour a day.  Control your pain to do that.   ° °CONTROL PAIN °Control pain so that you can walk, sleep, tolerate sneezing/coughing, go up/down stairs. ° °HAVE A BOWEL MOVEMENT DAILY °Keep your bowels regular to avoid problems.  OK to try a laxative to override constipation.  OK to use an antidairrheal to slow down diarrhea.  Call if not better after 2 tries ° °CALL IF YOU HAVE PROBLEMS/CONCERNS °Call if you are still struggling despite following these instructions. °Call if you have concerns not answered by these instructions ° °###################################################################### ° ° ° °1. DIET: Follow a light bland diet the first 24 hours after arrival home, such as soup, liquids, crackers, etc.  Be sure to include lots of fluids daily.  Avoid fast food or heavy meals as your are more likely to get nauseated.  Eat a low fat the next few days after  surgery.   °2. Take your usually prescribed home medications unless otherwise directed. °3. PAIN CONTROL: °a. Pain is best controlled by a usual combination of three different methods TOGETHER: °i. Ice/Heat °ii. Over the counter pain medication °iii. Prescription pain medication °b. Most patients will experience some swelling and bruising around the incisions.  Ice packs or heating pads (30-60 minutes up to 6 times a day) will help. Use ice for the first few days to help decrease swelling and bruising, then switch to heat to help relax tight/sore spots and speed recovery.  Some people prefer to use ice alone, heat alone, alternating between ice & heat.  Experiment to what works for you.  Swelling and bruising can take several weeks to resolve.   °c. It is helpful to take an over-the-counter pain medication regularly for the first few weeks.  Choose one of the following that works best for you: °i. Naproxen (Aleve, etc)  Two 220mg tabs twice a day °ii. Ibuprofen (Advil, etc) Three 200mg tabs four times a day (every meal & bedtime) °iii. Acetaminophen (Tylenol, etc) 500-650mg four times a day (every meal & bedtime) °d. A  prescription for pain medication (such as oxycodone, hydrocodone, etc) should be given to you upon discharge.  Take your pain medication as prescribed.  °i. If you are having problems/concerns with the prescription medicine (does not control pain, nausea, vomiting, rash, itching, etc), please call us [phone] to see if we need to switch you to a different pain medicine that will work better for you and/or control your side effect better. °ii. If you need a refill on your pain medication, please contact your pharmacy.  They will contact our office to request authorization. Prescriptions will not be filled after 5 pm or on week-ends. °4. Avoid getting   constipated.  Between the surgery and the pain medications, it is common to experience some constipation.  Increasing fluid intake and taking a  fiber supplement (such as Metamucil, Citrucel, FiberCon, MiraLax, etc) 1-2 times a day regularly will usually help prevent this problem from occurring.  A mild laxative (prune juice, Milk of Magnesia, MiraLax, etc) should be taken according to package directions if there are no bowel movements after 48 hours.   °5. Watch out for diarrhea.  If you have many loose bowel movements, simplify your diet to bland foods & liquids for a few days.  Stop any stool softeners and decrease your fiber supplement.  Switching to mild anti-diarrheal medications (Kayopectate, Pepto Bismol) can help.  If this worsens or does not improve, please call us. °6. Wash / shower every day.  You may shower over the dressings as they are waterproof.  Continue to shower over incision(s) after the dressing is off. °7. Remove your waterproof bandages 5 days after surgery.  You may leave the incision open to air.  You may replace a dressing/Band-Aid to cover the incision for comfort if you wish.  °8. ACTIVITIES as tolerated:   °a. You may resume regular (light) daily activities beginning the next day--such as daily self-care, walking, climbing stairs--gradually increasing activities as tolerated.  If you can walk 30 minutes without difficulty, it is safe to try more intense activity such as jogging, treadmill, bicycling, low-impact aerobics, swimming, etc. °b. Save the most intensive and strenuous activity for last such as sit-ups, heavy lifting, contact sports, etc  Refrain from any heavy lifting or straining until you are off narcotics for pain control.   °c. DO NOT PUSH THROUGH PAIN.  Let pain be your guide: If it hurts to do something, don't do it.  Pain is your body warning you to avoid that activity for another week until the pain goes down. °d. You may drive when you are no longer taking prescription pain medication, you can comfortably wear a seatbelt, and you can safely maneuver your car and apply brakes. °e. You may have sexual intercourse  when it is comfortable.  °9. FOLLOW UP in our office °a. Please call [facility] at [phone] to set up an appointment to see your surgeon in the office for a follow-up appointment approximately 2-3 weeks after your surgery. °b. Make sure that you call for this appointment the day you arrive home to insure a convenient appointment time. °10. IF YOU HAVE DISABILITY OR FAMILY LEAVE FORMS, BRING THEM TO THE OFFICE FOR PROCESSING.  DO NOT GIVE THEM TO YOUR DOCTOR. ° ° °WHEN TO CALL US [phone]: °1. Poor pain control °2. Reactions / problems with new medications (rash/itching, nausea, etc)  °3. Fever over 101.5 F (38.5 C) °4. Inability to urinate °5. Nausea and/or vomiting °6. Worsening swelling or bruising °7. Continued bleeding from incision. °8. Increased pain, redness, or drainage from the incision ° ° The clinic staff is available to answer your questions during regular business hours (8:30am-5pm).  Please don’t hesitate to call and ask to speak to one of our nurses for clinical concerns.  ° If you have a medical emergency, go to the nearest emergency room or call 911. ° A surgeon from [facility] is always on call at the hospitals ° ° °[facility] °[address], [address], [place], [place]  [address] ? °MAIN: [phone] ? TOLL FREE: [phone] ?  °FAX [phone] °[email or web address] ° ° °

## 2017-10-17 DIAGNOSIS — E669 Obesity, unspecified: Secondary | ICD-10-CM | POA: Diagnosis not present

## 2017-10-17 DIAGNOSIS — Z79899 Other long term (current) drug therapy: Secondary | ICD-10-CM | POA: Diagnosis not present

## 2017-10-17 DIAGNOSIS — K449 Diaphragmatic hernia without obstruction or gangrene: Secondary | ICD-10-CM | POA: Diagnosis not present

## 2017-10-17 DIAGNOSIS — Z87891 Personal history of nicotine dependence: Secondary | ICD-10-CM | POA: Diagnosis not present

## 2017-10-17 DIAGNOSIS — K210 Gastro-esophageal reflux disease with esophagitis: Secondary | ICD-10-CM | POA: Diagnosis not present

## 2017-10-17 DIAGNOSIS — Z6837 Body mass index (BMI) 37.0-37.9, adult: Secondary | ICD-10-CM | POA: Diagnosis not present

## 2017-10-17 DIAGNOSIS — K2270 Barrett's esophagus without dysplasia: Secondary | ICD-10-CM | POA: Diagnosis not present

## 2017-10-17 DIAGNOSIS — E7800 Pure hypercholesterolemia, unspecified: Secondary | ICD-10-CM | POA: Diagnosis not present

## 2017-10-17 DIAGNOSIS — Z7982 Long term (current) use of aspirin: Secondary | ICD-10-CM | POA: Diagnosis not present

## 2017-10-17 NOTE — Discharge Summary (Signed)
Physician Discharge Summary  Patient ID: Victor David MRN: [id number] DOB/AGE: January 03, 1976 42 y.o.  Admit date: 10/14/2017 Discharge date: 10/17/2017  Admission Diagnoses: Hiatal hernia  Discharge Diagnoses:  Principal Problem:   Hiatal hernia s/p robotic repair & NIssen fundoplication 10/14/2017 Active Problems:   GERD (gastroesophageal reflux disease)   Pure hypercholesterolemia   Obesity (BMI 30-39.9)   Discharged Condition: good  Hospital Course: Patient underwent robotic hiatal hernia repair and Nissen fundoplication as per Dr. Alden HippGrote surgical note.  Please review note for full details. Postoperatively patient was sent to the floor.  He underwent esophagogram which did reveal no leaks.  Patient was started on liquid diet.  Patient had dietitian education.  Patient was up and ambulating well on his own.  Patient had minimal pain.  Patient did have some dysphagia on postop day 1 or 2.  Subsequent resolved.  Patient was able to tolerate liquid diet without any issues.  Patient was otherwise afebrile, deemed stable for discharge and discharged home.  Consults: None  Significant Diagnostic Studies: Esophagram showed no leaks  Treatments: surgery: As above  Discharge Exam: Blood pressure 130/74, pulse 98, temperature 97.8 F (36.6 C), temperature source Oral, resp. rate 16, height 6' (1.829 m), weight 126.6 kg (279 lb), SpO2 99 %. General appearance: alert and cooperative GI: soft, non-tender; bowel sounds normal; no masses,  no organomegaly  Disposition: Discharge disposition: 01-Home or Self Care       Discharge Instructions    Call MD for:   Complete by:  As directed    FEVER > 101.5 F  (temperatures < 101.5 F are not significant)   Call MD for:  extreme fatigue   Complete by:  As directed    Call MD for:  persistant dizziness or light-headedness   Complete by:  As directed    Call MD for:  persistant nausea and vomiting   Complete by:  As directed    Call MD for:   redness, tenderness, or signs of infection (pain, swelling, redness, odor or green/yellow discharge around incision site)   Complete by:  As directed    Call MD for:  severe uncontrolled pain   Complete by:  As directed    Diet - low sodium heart healthy   Complete by:  As directed    Start with a bland diet such as soups, liquids, starchy foods, low fat foods, etc. the first few days at home. Gradually advance to a solid, low-fat, high fiber diet by the end of the first week at home.   Add a fiber supplement to your diet (Metamucil, etc) If you feel full, bloated, or constipated, stay on a full liquid or pureed/blenderized diet for a few days until you feel better and are no longer constipated.   Discharge instructions   Complete by:  As directed    See Discharge Instructions If you are not getting better after two weeks or are noticing you are getting worse, contact our office [phone] for further advice.  We may need to adjust your medications, re-evaluate you in the office, send you to the emergency room, or see what other things we can do to help. The clinic staff is available to answer your questions during regular business hours (8:30am-5pm).  Please don't hesitate to call and ask to speak to one of our nurses for clinical concerns.    [facility] is always on call at the hospitals 24 hours/day If you have a medical emergency,  go to the nearest emergency room or call 911.   Discharge wound care:   Complete by:  As directed    It is good for closed incisions to be washed every day.  Shower every day.  Short baths are fine.  Wash the incisions and wounds clean with soap & water.   Remove dressings POD#5 = next Tuesday 7/30  You may leave closed incisions open to air if it is dry.   You may cover the incision with clean gauze & replace it after your daily shower for comfort.   Driving Restrictions   Complete by:  As directed    You may drive when: - you  are no longer taking narcotic prescription pain medication - you can comfortably wear a seatbelt - you can safely make sudden turns/stops without pain.   Increase activity slowly   Complete by:  As directed    Start light daily activities --- self-care, walking, climbing stairs- beginning the day after surgery.  Gradually increase activities as tolerated.  Control your pain to be active.  Stop when you are tired.  Ideally, walk several times a day, eventually an hour a day.   Most people are back to most day-to-day activities in a few weeks.  It takes 4-6 weeks to get back to unrestricted, intense activity. If you can walk 30 minutes without difficulty, it is safe to try more intense activity such as jogging, treadmill, bicycling, low-impact aerobics, swimming, etc. Save the most intensive and strenuous activity for last (Usually 4-8 weeks after surgery) such as sit-ups, heavy lifting, contact sports, etc.  Refrain from any intense heavy lifting or straining until you are off narcotics for pain control.  You will have off days, but things should improve week-by-week. DO NOT PUSH THROUGH PAIN.  Let pain be your guide: If it hurts to do something, don't do it.   Increase activity slowly   Complete by:  As directed    Lifting restrictions   Complete by:  As directed    If you can walk 30 minutes without difficulty, it is safe to try more intense activity such as jogging, treadmill, bicycling, low-impact aerobics, swimming, etc. Save the most intensive and strenuous activity for last (Usually 4-8 weeks after surgery) such as sit-ups, heavy lifting, contact sports, etc.   Refrain from any intense heavy lifting or straining until you are off narcotics for pain control.  You will have off days, but things should improve week-by-week. DO NOT PUSH THROUGH PAIN.  Let pain be your guide: If it hurts to do something, don't do it.  Pain is your body warning you to avoid that activity for another week until the  pain goes down.   May shower / Bathe   Complete by:  As directed    May walk up steps   Complete by:  As directed    Sexual Activity Restrictions   Complete by:  As directed    You may have sexual intercourse when it is comfortable. If it hurts to do something, stop.     Allergies as of 10/17/2017   No Known Allergies     Medication List    TAKE these medications   aspirin EC 81 MG tablet Take 1 tablet (81 mg total) by mouth daily.   atorvastatin 40 MG tablet Commonly known as:  LIPITOR Take 1 tablet (40 mg total) by mouth daily. What changed:  when to take this   esomeprazole 20 MG packet Commonly known  as:  NEXIUM Take 20 mg by mouth 2 (two) times daily.   famotidine 10 MG chewable tablet Commonly known as:  PEPCID AC Chew 10 mg by mouth 2 (two) times daily as needed (stomach pain).   ibuprofen 200 MG tablet Commonly known as:  ADVIL,MOTRIN Take 400 mg by mouth daily as needed for headache, mild pain or moderate pain.   Melatonin 10 MG Tabs Take 20 mg by mouth at bedtime.   ondansetron 4 MG tablet Commonly known as:  ZOFRAN Take 1 tablet (4 mg total) by mouth every 8 (eight) hours as needed for nausea.   oxymetazoline 0.05 % nasal spray Commonly known as:  AFRIN Place 1 spray into both nostrils daily as needed for congestion.   promethazine 25 MG suppository Commonly known as:  PHENERGAN Place 1 suppository (25 mg total) rectally every 8 (eight) hours as needed for nausea or vomiting.   traMADol 50 MG tablet Commonly known as:  ULTRAM Take 1-2 tablets (50-100 mg total) by mouth every 6 (six) hours as needed for moderate pain or severe pain.            Discharge Care Instructions  (From admission, onward)        Start     Ordered   10/15/17 0000  Discharge wound care:    Comments:  It is good for closed incisions to be washed every day.  Shower every day.  Short baths are fine.  Wash the incisions and wounds clean with soap & water.   Remove  dressings POD#5 = next Tuesday 7/30  You may leave closed incisions open to air if it is dry.   You may cover the incision with clean gauze & replace it after your daily shower for comfort.   10/15/17 1243     Follow-up Information    Schedule an appointment as soon as possible for a visit with Karie Soda, MD.   Specialty:  General Surgery Why:  To follow up after your operation, To follow up after your hospital stay Contact information: [address] [place] [address] [phone]           Signed: Marigene Ehlers., Jed Limerick 10/17/2017, 8:13 AM

## 2017-12-13 DIAGNOSIS — Z1211 Encounter for screening for malignant neoplasm of colon: Secondary | ICD-10-CM | POA: Diagnosis not present

## 2017-12-13 DIAGNOSIS — Z136 Encounter for screening for cardiovascular disorders: Secondary | ICD-10-CM | POA: Diagnosis not present

## 2017-12-13 DIAGNOSIS — Z1331 Encounter for screening for depression: Secondary | ICD-10-CM | POA: Diagnosis not present

## 2017-12-13 DIAGNOSIS — Z0000 Encounter for general adult medical examination without abnormal findings: Secondary | ICD-10-CM | POA: Diagnosis not present

## 2017-12-13 DIAGNOSIS — Z131 Encounter for screening for diabetes mellitus: Secondary | ICD-10-CM | POA: Diagnosis not present

## 2017-12-13 DIAGNOSIS — E785 Hyperlipidemia, unspecified: Secondary | ICD-10-CM | POA: Diagnosis not present

## 2017-12-13 DIAGNOSIS — Z0112 Encounter for hearing conservation and treatment: Secondary | ICD-10-CM | POA: Diagnosis not present

## 2017-12-13 DIAGNOSIS — Z1329 Encounter for screening for other suspected endocrine disorder: Secondary | ICD-10-CM | POA: Diagnosis not present

## 2017-12-13 DIAGNOSIS — N529 Male erectile dysfunction, unspecified: Secondary | ICD-10-CM | POA: Diagnosis not present

## 2017-12-13 DIAGNOSIS — Z125 Encounter for screening for malignant neoplasm of prostate: Secondary | ICD-10-CM | POA: Diagnosis not present

## 2018-04-08 ENCOUNTER — Other Ambulatory Visit: Payer: Self-pay | Admitting: Adult Health

## 2018-07-26 DIAGNOSIS — R1013 Epigastric pain: Secondary | ICD-10-CM | POA: Diagnosis not present

## 2018-08-02 ENCOUNTER — Other Ambulatory Visit (HOSPITAL_COMMUNITY): Payer: Self-pay | Admitting: Surgery

## 2018-08-02 ENCOUNTER — Other Ambulatory Visit: Payer: Self-pay | Admitting: Surgery

## 2018-08-02 DIAGNOSIS — R109 Unspecified abdominal pain: Secondary | ICD-10-CM

## 2018-08-05 ENCOUNTER — Ambulatory Visit (HOSPITAL_COMMUNITY): Payer: BLUE CROSS/BLUE SHIELD

## 2018-08-05 ENCOUNTER — Encounter (HOSPITAL_COMMUNITY): Payer: Self-pay

## 2018-08-08 ENCOUNTER — Ambulatory Visit (HOSPITAL_COMMUNITY): Payer: BLUE CROSS/BLUE SHIELD

## 2020-08-26 ENCOUNTER — Other Ambulatory Visit: Payer: Self-pay | Admitting: Physician Assistant

## 2020-08-26 DIAGNOSIS — R140 Abdominal distension (gaseous): Secondary | ICD-10-CM

## 2020-08-26 DIAGNOSIS — R1011 Right upper quadrant pain: Secondary | ICD-10-CM

## 2020-08-26 DIAGNOSIS — R1012 Left upper quadrant pain: Secondary | ICD-10-CM

## 2020-09-12 ENCOUNTER — Ambulatory Visit
Admission: RE | Admit: 2020-09-12 | Discharge: 2020-09-12 | Disposition: A | Discharge status: Home / Self Care | Payer: Managed Care, Other (non HMO) | Source: Ambulatory Visit | Attending: Physician Assistant | Admitting: Physician Assistant

## 2020-09-12 ENCOUNTER — Other Ambulatory Visit: Payer: Self-pay

## 2020-09-12 DIAGNOSIS — R1011 Right upper quadrant pain: Secondary | ICD-10-CM

## 2020-09-12 DIAGNOSIS — R1012 Left upper quadrant pain: Secondary | ICD-10-CM

## 2020-09-12 DIAGNOSIS — R140 Abdominal distension (gaseous): Secondary | ICD-10-CM

## 2022-08-03 ENCOUNTER — Ambulatory Visit: Admit: 2022-08-03 | Discharge status: Against Medical Advice | Payer: Managed Care, Other (non HMO)

## 2022-08-04 ENCOUNTER — Ambulatory Visit: Payer: Managed Care, Other (non HMO)

## 2022-09-18 IMAGING — US US ABDOMEN COMPLETE
1 series · 14 of 25 positions shown · non-contrast
Comparison: CT February 09, 2016.

CLINICAL DATA: Right upper quadrant abdominal pain and bloating
with diarrhea x3 months

EXAM:
ABDOMEN ULTRASOUND COMPLETE

[Series 1: us abdomen complete · 0.14mm/px · 14 of 66 slices shown]
[im 1/66]
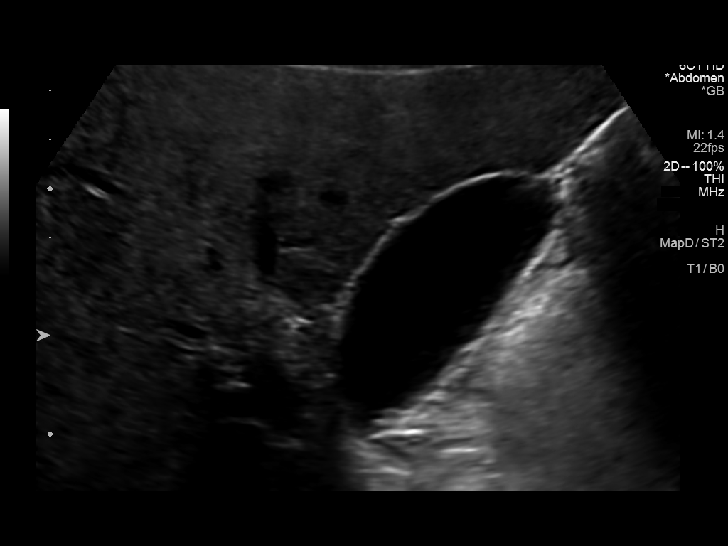
[im 6/66]
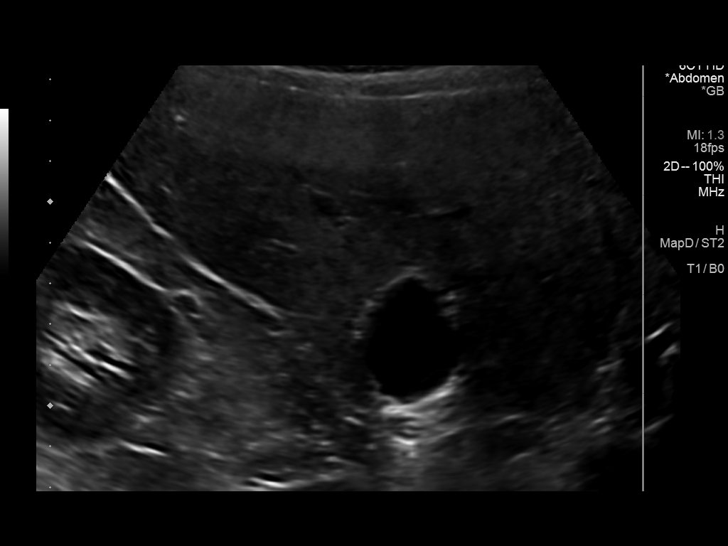
[im 11/66]
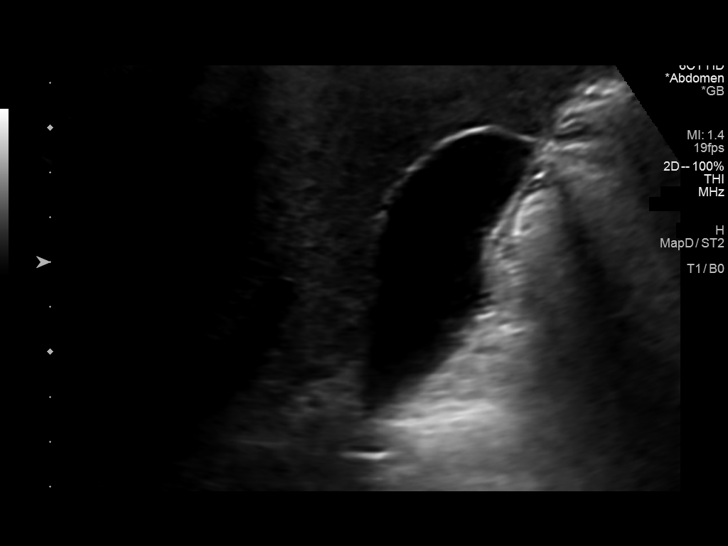
[im 17/66]
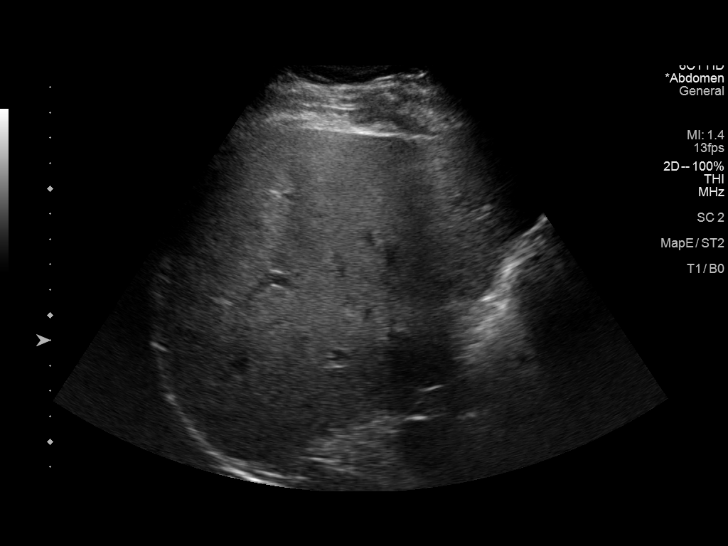
[im 22/66]
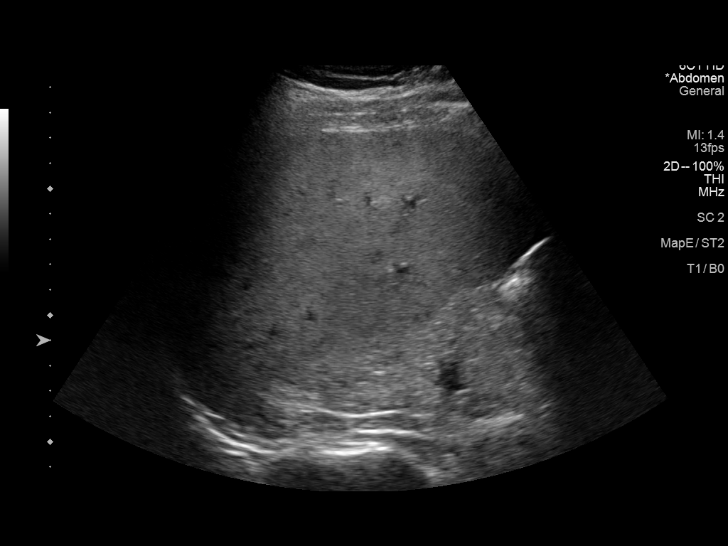
[im 25/66]
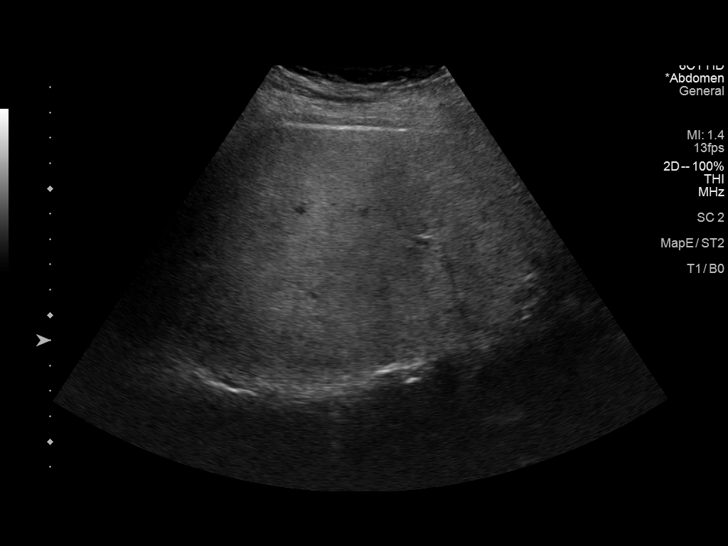
[im 30/66]
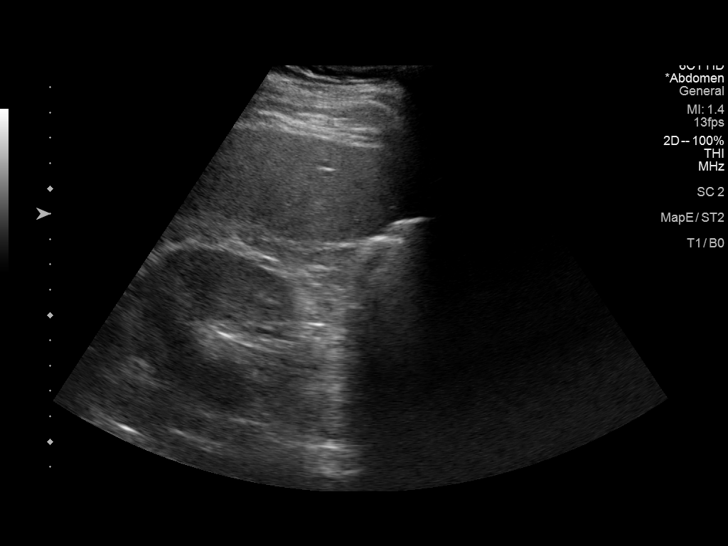
[im 36/66]
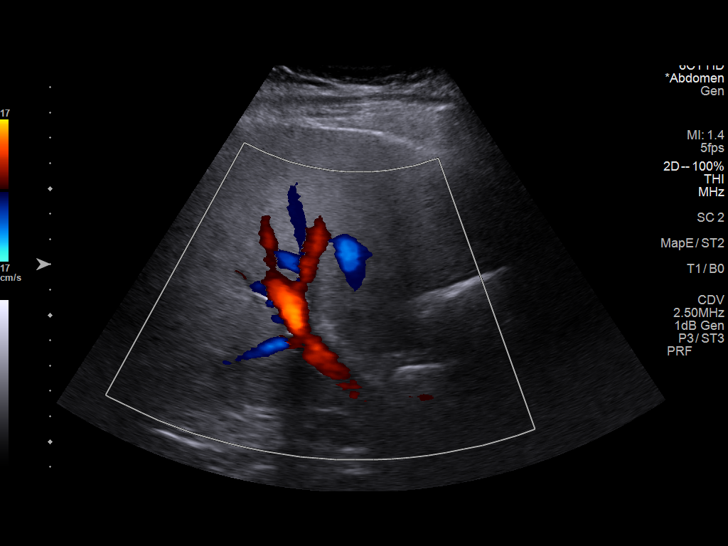
[im 41/66]
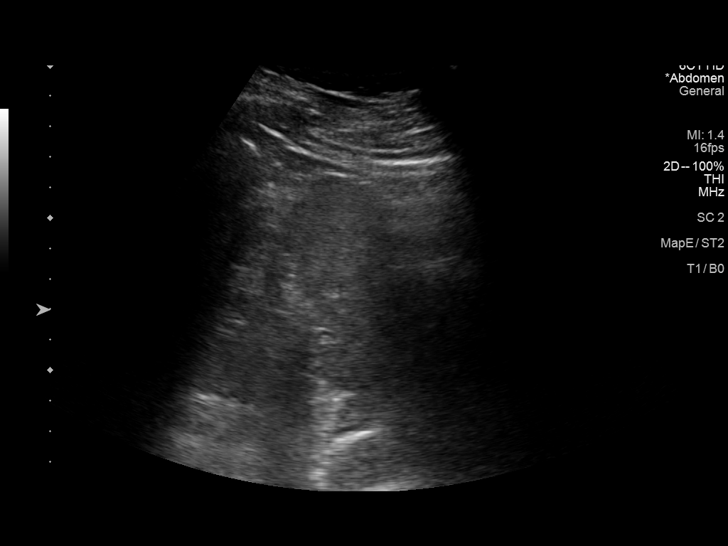
[im 44/66]
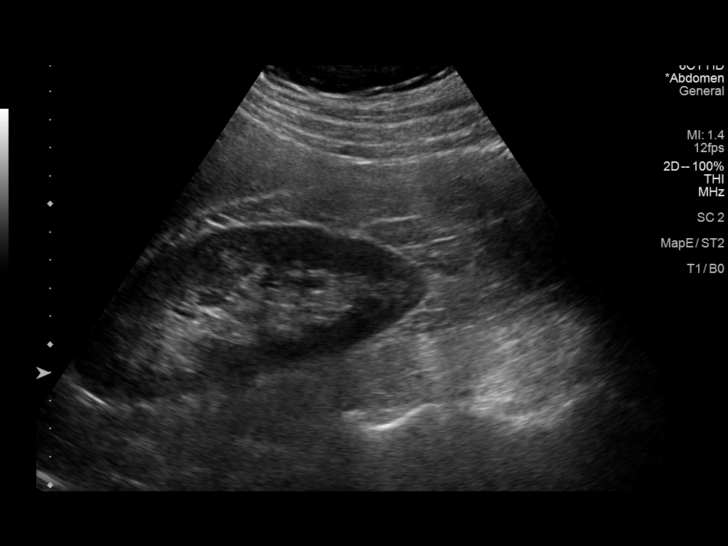
[im 49/66]
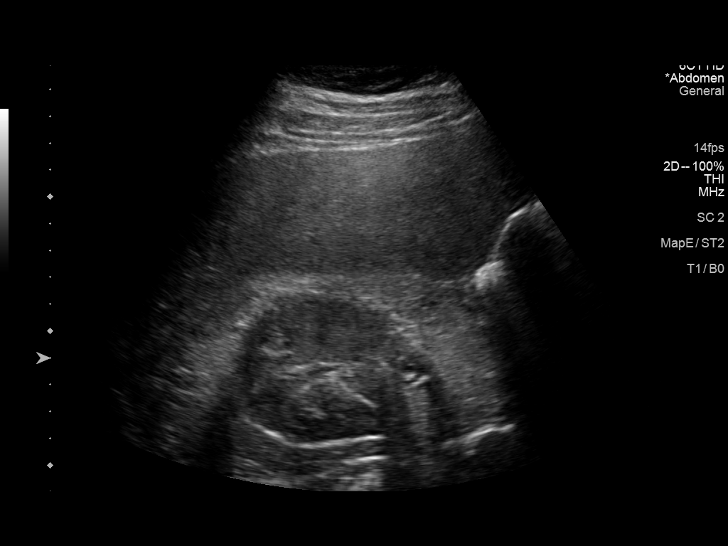
[im 55/66]
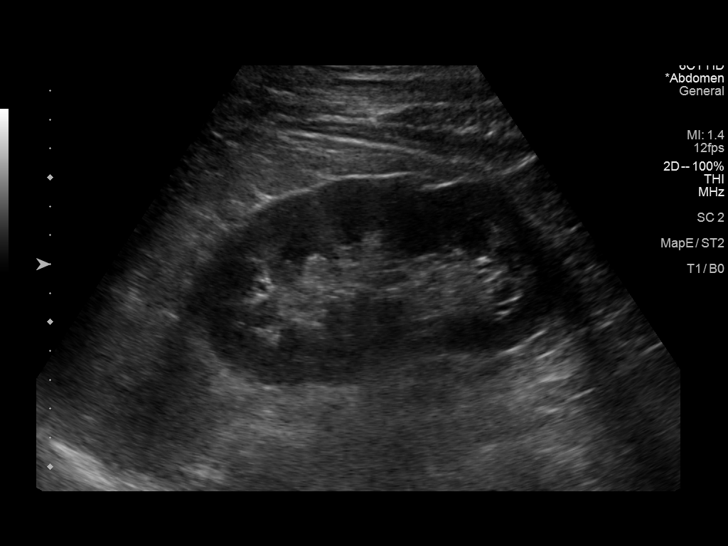
[im 60/66]
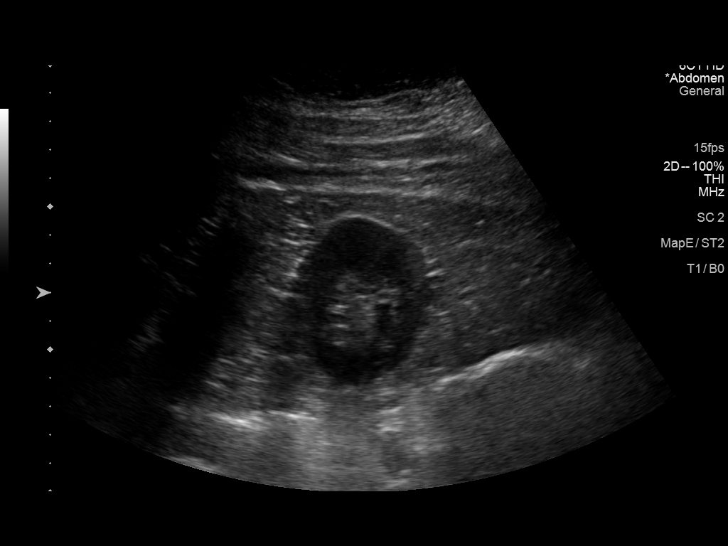
[im 66/66]
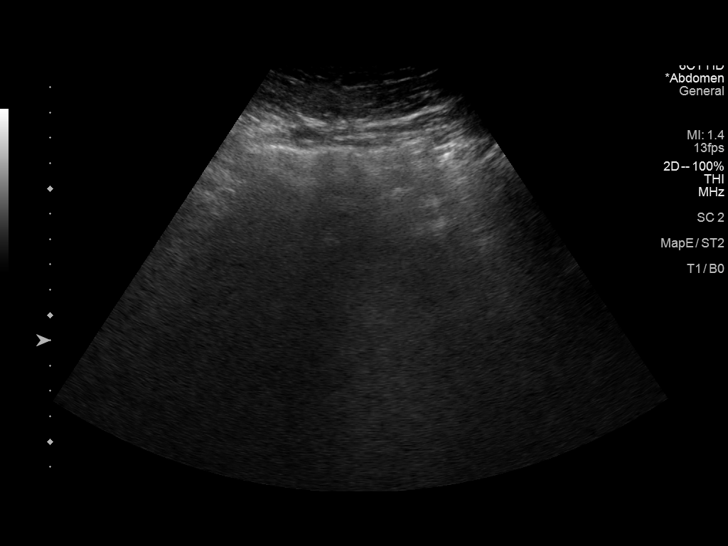

[14 of 25 positions shown; findings below may reference images not displayed]

FINDINGS: Gallbladder: No gallstones or wall thickening visualized. No
sonographic Murphy sign noted by sonographer.

Common bile duct: Diameter: 4 mm

Liver: No focal lesion identified. Heterogeneously increased
echogenicity of the hepatic parenchyma. Portal vein is patent on
color Doppler imaging with normal direction of blood flow towards
the liver.

IVC: No abnormality visualized.

Pancreas: Obscured by bowel gas.

Spleen: Size and appearance within normal limits.

Right Kidney: Length: 13.2 cm. Echogenicity within normal limits. No
mass or hydronephrosis visualized.

Left Kidney: Length: 13.1 cm. Echogenicity within normal limits. No
mass or hydronephrosis visualized.

Abdominal aorta: No aneurysm visualized.

Other findings: None.
IMPRESSION: 1. Heterogeneously increased echogenicity of the hepatic parenchyma.
Findings are nonspecific but can be seen in the setting of fatty
infiltration of the liver. There are no obvious focal liver lesions.
2. The pancreas is obscured by bowel gas.

## 2023-09-15 ENCOUNTER — Emergency Department (HOSPITAL_BASED_OUTPATIENT_CLINIC_OR_DEPARTMENT_OTHER)

## 2023-09-15 ENCOUNTER — Emergency Department (HOSPITAL_BASED_OUTPATIENT_CLINIC_OR_DEPARTMENT_OTHER)
Admission: EM | Admit: 2023-09-15 | Discharge: 2023-09-15 | Disposition: A | Discharge status: Home / Self Care | Attending: Emergency Medicine | Admitting: Emergency Medicine

## 2023-09-15 ENCOUNTER — Encounter (HOSPITAL_BASED_OUTPATIENT_CLINIC_OR_DEPARTMENT_OTHER): Payer: Self-pay | Admitting: Emergency Medicine

## 2023-09-15 ENCOUNTER — Other Ambulatory Visit: Payer: Self-pay

## 2023-09-15 DIAGNOSIS — R110 Nausea: Secondary | ICD-10-CM | POA: Insufficient documentation

## 2023-09-15 DIAGNOSIS — R1084 Generalized abdominal pain: Secondary | ICD-10-CM | POA: Insufficient documentation

## 2023-09-15 DIAGNOSIS — Z87891 Personal history of nicotine dependence: Secondary | ICD-10-CM | POA: Insufficient documentation

## 2023-09-15 LAB — URINALYSIS, ROUTINE W REFLEX MICROSCOPIC
Bilirubin Urine: NEGATIVE
Glucose, UA: NEGATIVE mg/dL
Hgb urine dipstick: NEGATIVE
Ketones, ur: NEGATIVE mg/dL
Leukocytes,Ua: NEGATIVE
Nitrite: NEGATIVE
Protein, ur: NEGATIVE mg/dL
Specific Gravity, Urine: 1.015 (ref 1.005–1.030)
pH: 8 (ref 5.0–8.0)

## 2023-09-15 LAB — COMPREHENSIVE METABOLIC PANEL WITH GFR
ALT: 31 U/L (ref 0–44)
AST: 26 U/L (ref 15–41)
Albumin: 4.6 g/dL (ref 3.5–5.0)
Alkaline Phosphatase: 95 U/L (ref 38–126)
Anion gap: 11 (ref 5–15)
BUN: 15 mg/dL (ref 6–20)
CO2: 27 mmol/L (ref 22–32)
Calcium: 10.1 mg/dL (ref 8.9–10.3)
Chloride: 102 mmol/L (ref 98–111)
Creatinine, Ser: 1.04 mg/dL (ref 0.61–1.24)
GFR, Estimated: >60 mL/min (ref >60)
Glucose, Bld: 107 mg/dL — ABNORMAL HIGH (ref 70–99)
Potassium: 3.8 mmol/L (ref 3.5–5.1)
Sodium: 140 mmol/L (ref 135–145)
Total Bilirubin: 0.8 mg/dL (ref 0.0–1.2)
Total Protein: 7.4 g/dL (ref 6.5–8.1)

## 2023-09-15 LAB — CBC
HCT: 46.7 % (ref 39.0–52.0)
Hemoglobin: 16 g/dL (ref 13.0–17.0)
MCH: 30.4 pg (ref 26.0–34.0)
MCHC: 34.3 g/dL (ref 30.0–36.0)
MCV: 88.6 fL (ref 80.0–100.0)
Platelets: 299 10*3/uL (ref 150–400)
RBC: 5.27 MIL/uL (ref 4.22–5.81)
RDW: 12.6 % (ref 11.5–15.5)
WBC: 12 10*3/uL — ABNORMAL HIGH (ref 4.0–10.5)
nRBC: 0 % (ref 0.0–0.2)

## 2023-09-15 LAB — LIPASE, BLOOD: Lipase: 27 U/L (ref 11–51)

## 2023-09-15 MED ORDER — FAMOTIDINE IN NACL 20-0.9 MG/50ML-% IV SOLN
20.0000 mg | Freq: Once | INTRAVENOUS | Status: AC
  Administered 2023-09-15: 20 mg via INTRAVENOUS
  Filled 2023-09-15: qty 50

## 2023-09-15 MED ORDER — ALUM & MAG HYDROXIDE-SIMETH 200-200-20 MG/5ML PO SUSP
30.0000 mL | Freq: Once | ORAL | Status: AC
  Administered 2023-09-15: 30 mL via ORAL
  Filled 2023-09-15: qty 30

## 2023-09-15 MED ORDER — IOHEXOL 300 MG/ML  SOLN
125.0000 mL | Freq: Once | INTRAMUSCULAR | Status: AC | PRN
  Administered 2023-09-15: 125 mL via INTRAVENOUS

## 2023-09-15 MED ORDER — DIPHENHYDRAMINE HCL 50 MG/ML IJ SOLN
25.0000 mg | Freq: Once | INTRAMUSCULAR | Status: AC
  Administered 2023-09-15: 25 mg via INTRAVENOUS
  Filled 2023-09-15: qty 1

## 2023-09-15 MED ORDER — DICYCLOMINE HCL 20 MG PO TABS: 20.0000 mg | ORAL_TABLET | Freq: Two times a day (BID) | ORAL | 0 refills | Status: AC

## 2023-09-15 MED ORDER — PROCHLORPERAZINE EDISYLATE 10 MG/2ML IJ SOLN
10.0000 mg | Freq: Once | INTRAMUSCULAR | Status: AC
  Administered 2023-09-15: 10 mg via INTRAVENOUS
  Filled 2023-09-15: qty 2

## 2023-09-15 MED ORDER — FENTANYL CITRATE PF 50 MCG/ML IJ SOSY
50.0000 ug | PREFILLED_SYRINGE | Freq: Once | INTRAMUSCULAR | Status: AC
  Administered 2023-09-15: 50 ug via INTRAVENOUS
  Filled 2023-09-15: qty 1

## 2023-09-15 NOTE — Discharge Instructions (Signed)
 You were evaluated in the Emergency Department and after careful evaluation, we did not find any emergent condition requiring admission or further testing in the hospital.  Your exam/testing today is overall reassuring.  Can use the Bentyl as needed for abdominal pain, recommend limiting your diet for the next couple days, plenty fluids.  Please return to the Emergency Department if you experience any worsening of your condition.   Thank you for allowing us  to be a part of your care.

## 2023-09-15 NOTE — ED Triage Notes (Signed)
 Pt presents to the ED via POV with complaints of R sided abdominal pain with associated bloating and gas that started around 1600 yesterday. States the pain feels like a sharp pressure. A&Ox4 at this time. Denies CP or SOB.

## 2023-09-15 NOTE — ED Provider Notes (Signed)
 [facility] Emergency Department Provider Note MRN:  [id number]  Arrival date & time: 09/15/23     Chief Complaint   Abdominal Pain   History of Present Illness   Victor David is a 48 y.o. year-old male with a history of Niesen fundoplication presenting to the ED with chief complaint of abdominal pain.  Epigastric and right upper quadrant abdominal pain starting at 4 PM today.  Initially mild, thought maybe the pain was related to hunger.  Had a sandwich but that did not help.  Pain progressed throughout the day, trouble sleeping tonight due to pain that is worse when laying flat.  Does not really feel like acid reflux though he has not had any acid reflux in years since his fundoplication surgery.  Mild nausea but no vomiting, no diarrhea or constipation, no fever.  No chest pain or shortness of breath.  Review of Systems  A thorough review of systems was obtained and all systems are negative except as noted in the HPI and PMH.   Patient's Health History    Past Medical History:  Diagnosis Date   Barrett's esophagus    Chest pain due to GERD    History of hiatal hernia    Mild pulmonary hypertension (HCC)     Past Surgical History:  Procedure Laterality Date   ESOPHAGEAL MANOMETRY N/A 08/30/2017   Procedure: ESOPHAGEAL MANOMETRY (EM);  Surgeon: Lennard Lesta FALCON, MD;  Location: [facility] ENDOSCOPY;  Service: Endoscopy;  Laterality: N/A;   INSERTION OF MESH N/A 10/14/2017   Procedure: INSERTION OF MESH;  Surgeon: Sheldon Standing, MD;  Location: [facility] ORS;  Service: General;  Laterality: N/A;   KNEE SURGERY Right    meniscus    TUMOR REMOVAL     from finger    WISDOM TOOTH EXTRACTION      Family History  Problem Relation Age of Onset   Heart attack Father    Heart attack Paternal Grandfather    High Cholesterol Brother    High Cholesterol Brother     Social History   Socioeconomic History   Marital status: Married    Spouse name: Not on file   Number of  children: Not on file   Years of education: Not on file   Highest education level: Not on file  Occupational History   Not on file  Tobacco Use   Smoking status: Former    Current packs/day: 0.00    Average packs/day: 1 pack/day for 20.0 years (20.0 ttl pk-yrs)    Types: Cigarettes    Start date: 10/09/1987    Quit date: 10/09/2007    Years since quitting: 15.9   Smokeless tobacco: Current    Types: Chew  Vaping Use   Vaping status: Never Used  Substance and Sexual Activity   Alcohol use: No   Drug use: Not Currently    Types: Marijuana    Comment: las time was 2 years ago    Sexual activity: Not on file  Other Topics Concern   Not on file  Social History Narrative   Not on file   Social Drivers of Health   Financial Resource Strain: Not on file  Food Insecurity: Low Risk  (06/17/2022)   Received from [facility]   Hunger Vital Sign    Within the past 12 months, you worried that your food would run out before you got money to buy more: Never true    Within the past 12 months, the food you bought  just didn't last and you didn't have money to get more. : Never true  Transportation Needs: No Transportation Needs (06/17/2022)   Received from [facility]   Transportation    In the past 12 months, has lack of reliable transportation kept you from medical appointments, meetings, work or from getting things needed for daily living? : No  Physical Activity: Not on file  Stress: Not on file  Social Connections: Unknown (01/30/2021)   Received from [facility]   Social Connections    Frequency of Communication with Friends and Family: Not asked    Frequency of Social Gatherings with Friends and Family: Not asked  Intimate Partner Violence: Unknown (01/30/2021)   Received from [facility]   Intimate Partner Violence    Fear of Current or Ex-Partner: Not asked    Emotionally Abused: Not asked    Physically Abused: Not asked    Sexually Abused: Not asked     Physical  Exam   Vitals:   09/15/23 0115  BP: (!) 153/89  Pulse: 91  Resp: 20  Temp: 98.1 F (36.7 C)  SpO2: 99%    CONSTITUTIONAL: Well-appearing, NAD NEURO/PSYCH:  Alert and oriented x 3, no focal deficits EYES:  eyes equal and reactive ENT/NECK:  no LAD, no JVD CARDIO: Regular rate, well-perfused, normal S1 and S2 PULM:  CTAB no wheezing or rhonchi GI/GU:  non-distended, mild epigastric tenderness to palpation MSK/SPINE:  No gross deformities, no edema SKIN:  no rash, atraumatic   *Additional and/or pertinent findings included in MDM below  Diagnostic and Interventional Summary    EKG Interpretation Date/Time:    Ventricular Rate:    PR Interval:    QRS Duration:    QT Interval:    QTC Calculation:   R Axis:      Text Interpretation:         Labs Reviewed  COMPREHENSIVE METABOLIC PANEL WITH GFR - Abnormal; Notable for the following components:      Result Value   Glucose, Bld 107 (*)    All other components within normal limits  CBC - Abnormal; Notable for the following components:   WBC 12.0 (*)    All other components within normal limits  URINALYSIS, ROUTINE W REFLEX MICROSCOPIC - Abnormal; Notable for the following components:   APPearance CLOUDY (*)    All other components within normal limits  LIPASE, BLOOD    CT ABDOMEN PELVIS W CONTRAST  Final Result      Medications  alum & mag hydroxide-simeth (MAALOX/MYLANTA) 200-200-20 MG/5ML suspension 30 mL (30 mLs Oral Given 09/15/23 0303)  iohexol (OMNIPAQUE) 300 MG/ML solution 125 mL (125 mLs Intravenous Contrast Given 09/15/23 0245)  diphenhydrAMINE  (BENADRYL ) injection 25 mg (25 mg Intravenous Given 09/15/23 0321)  prochlorperazine  (COMPAZINE ) injection 10 mg (10 mg Intravenous Given 09/15/23 0321)  famotidine  (PEPCID ) IVPB 20 mg premix (0 mg Intravenous Stopped 09/15/23 0354)  fentaNYL  (SUBLIMAZE ) injection 50 mcg (50 mcg Intravenous Given 09/15/23 0321)     Procedures  /  Critical Care Procedures  ED Course  and Medical Decision Making  Initial Impression and Ddx Differential diagnosis includes pancreatitis, cholecystitis, biliary colic, GERD, peptic ulcer disease.  Past medical/surgical history that increases complexity of ED encounter: Niesen fundoplication  Interpretation of Diagnostics I personally reviewed the Laboratory Testing and my interpretation is as follows: No significant blood count or electrolyte disturbance.  CT unremarkable  Patient Reassessment and Ultimate Disposition/Management     Patient feeling much better after medications listed above, repeat  exam is very reassuring with no tenderness, specifically no Murphy sign.  Seems appropriate for discharge with return precautions.  Patient management required discussion with the following services or consulting groups:  None  Complexity of Problems Addressed Acute illness or injury that poses threat of life of bodily function  Additional Data Reviewed and Analyzed Further history obtained from: Prior labs/imaging results  Additional Factors Impacting ED Encounter Risk Use of parenteral controlled substances and Consideration of hospitalization  Ozell HERO. Theadore, MD [facility] Emergency Medicine [facility] [email or web address]  Final Clinical Impressions(s) / ED Diagnoses     ICD-10-CM   1. Generalized abdominal pain  R10.84       ED Discharge Orders          Ordered    dicyclomine (BENTYL) 20 MG tablet  2 times daily        09/15/23 0504             Discharge Instructions Discussed with and Provided to Patient:     Discharge Instructions      You were evaluated in the Emergency Department and after careful evaluation, we did not find any emergent condition requiring admission or further testing in the hospital.  Your exam/testing today is overall reassuring.  Can use the Bentyl as needed for abdominal pain, recommend limiting your diet for the next couple days, plenty  fluids.  Please return to the Emergency Department if you experience any worsening of your condition.   Thank you for allowing us  to be a part of your care.       Theadore Ozell HERO, MD 09/15/23 [phone]

## 2023-09-15 NOTE — ED Notes (Signed)
 Patient transported to CT

## 2024-02-27 ENCOUNTER — Emergency Department (HOSPITAL_BASED_OUTPATIENT_CLINIC_OR_DEPARTMENT_OTHER)

## 2024-02-27 ENCOUNTER — Emergency Department (HOSPITAL_BASED_OUTPATIENT_CLINIC_OR_DEPARTMENT_OTHER)
Admission: EM | Admit: 2024-02-27 | Discharge: 2024-02-28 | Disposition: A | Attending: Emergency Medicine | Admitting: Emergency Medicine

## 2024-02-27 ENCOUNTER — Encounter (HOSPITAL_BASED_OUTPATIENT_CLINIC_OR_DEPARTMENT_OTHER): Payer: Self-pay | Admitting: Emergency Medicine

## 2024-02-27 DIAGNOSIS — R000 Tachycardia, unspecified: Secondary | ICD-10-CM

## 2024-02-27 DIAGNOSIS — R0789 Other chest pain: Secondary | ICD-10-CM

## 2024-02-27 LAB — COMPREHENSIVE METABOLIC PANEL WITH GFR
ALT: 32 U/L (ref 0–44)
AST: 25 U/L (ref 15–41)
Albumin: 4.6 g/dL (ref 3.5–5.0)
Alkaline Phosphatase: 128 U/L — ABNORMAL HIGH (ref 38–126)
Anion gap: 13 (ref 5–15)
BUN: 14 mg/dL (ref 6–20)
CO2: 25 mmol/L (ref 22–32)
Calcium: 9.9 mg/dL (ref 8.9–10.3)
Chloride: 100 mmol/L (ref 98–111)
Creatinine, Ser: 0.91 mg/dL (ref 0.61–1.24)
GFR, Estimated: >60 mL/min (ref >60)
Glucose, Bld: 238 mg/dL — ABNORMAL HIGH (ref 70–99)
Potassium: 3.7 mmol/L (ref 3.5–5.1)
Sodium: 138 mmol/L (ref 135–145)
Total Bilirubin: 0.9 mg/dL (ref 0.0–1.2)
Total Protein: 7.4 g/dL (ref 6.5–8.1)

## 2024-02-27 LAB — CBC WITH DIFFERENTIAL/PLATELET
Abs Immature Granulocytes: 0.04 K/uL (ref 0.00–0.07)
Basophils Absolute: 0.1 K/uL (ref 0.0–0.1)
Basophils Relative: 0 %
Eosinophils Absolute: 0.3 K/uL (ref 0.0–0.5)
Eosinophils Relative: 2 %
HCT: 46.1 % (ref 39.0–52.0)
Hemoglobin: 16 g/dL (ref 13.0–17.0)
Immature Granulocytes: 0 %
Lymphocytes Relative: 19 %
Lymphs Abs: 3 K/uL (ref 0.7–4.0)
MCH: 30.8 pg (ref 26.0–34.0)
MCHC: 34.7 g/dL (ref 30.0–36.0)
MCV: 88.8 fL (ref 80.0–100.0)
Monocytes Absolute: 1.2 K/uL — ABNORMAL HIGH (ref 0.1–1.0)
Monocytes Relative: 7 %
Neutro Abs: 11.2 K/uL — ABNORMAL HIGH (ref 1.7–7.7)
Neutrophils Relative %: 72 %
Platelets: 280 K/uL (ref 150–400)
RBC: 5.19 MIL/uL (ref 4.22–5.81)
RDW: 12.6 % (ref 11.5–15.5)
WBC: 15.9 K/uL — ABNORMAL HIGH (ref 4.0–10.5)
nRBC: 0 % (ref 0.0–0.2)

## 2024-02-27 LAB — TROPONIN T, HIGH SENSITIVITY: Troponin T High Sensitivity: <15 ng/L (ref 0–19)

## 2024-02-27 LAB — PRO BRAIN NATRIURETIC PEPTIDE: Pro Brain Natriuretic Peptide: <50 pg/mL (ref <300.0)

## 2024-02-27 MED ORDER — IOHEXOL 350 MG/ML SOLN
75.0000 mL | Freq: Once | INTRAVENOUS | Status: AC | PRN
  Administered 2024-02-28: 75 mL via INTRAVENOUS

## 2024-02-27 MED ORDER — LORAZEPAM 2 MG/ML IJ SOLN
0.5000 mg | Freq: Once | INTRAMUSCULAR | Status: AC
  Administered 2024-02-27: 0.5 mg via INTRAVENOUS
  Filled 2024-02-27: qty 1

## 2024-02-27 NOTE — ED Triage Notes (Signed)
 Pt reports BP elevated and has chest discomfort that started today; sts it feels like a panic attack and he has shaky breath

## 2024-02-27 NOTE — ED Notes (Signed)
 ED Provider at bedside.

## 2024-02-27 NOTE — ED Notes (Addendum)
 Pt denies chest pressure , numbness in fingers, shortness of breath at this time. Patients states still has headache and mild tingling/numbness in feet.

## 2024-02-27 NOTE — ED Provider Notes (Signed)
 [place] [facility] AT [facility] Provider Note   CSN: [id number] Arrival date & time: 02/27/24  2230     Patient presents with: Chest Pain and Tachycardia   Victor David is a 48 y.o. male.  {Add pertinent medical, surgical, social history, OB history to HPI:32947} HPI Patient was doing some laundry and bent over and became very short of breath when he stood back up.  He felt like his heart was beating fast and he had a hard time feeling like he could get a deep breath.  He denies had chest pain but feels uncomfortable because he cannot catch a good breath.  Patient denies similar episode.  Patient denies history of panic attack.  He denies he has been feeling anxious.  He did recently have a trip to [place] where he drove.  He has not noted any leg swelling or calf pain.  No prior history of PE.  Patient does do Zen pouches which have 6 mg of nicotine each.  He usually does about 4-5 a day and has never had a problem with it.  Patient denies alcohol use.  Does not smoke cigarettes.  No other drug use.    Prior to Admission medications   Medication Sig Start Date End Date Taking? Authorizing Provider  aspirin  EC 81 MG tablet Take 1 tablet (81 mg total) by mouth daily. Patient not taking: Reported on 10/05/2017 04/14/17   Jerilynn Lamarr HERO, NP  atorvastatin  (LIPITOR) 40 MG tablet Take 1 tablet (40 mg total) by mouth daily. NEED OV. 04/08/18   Jerilynn Lamarr HERO, NP  dicyclomine  (BENTYL ) 20 MG tablet Take 1 tablet (20 mg total) by mouth 2 (two) times daily. 09/15/23   Theadore Ozell HERO, MD  esomeprazole  (NEXIUM ) 20 MG packet Take 20 mg by mouth 2 (two) times daily.     [provider]  famotidine  (PEPCID  AC) 10 MG chewable tablet Chew 10 mg by mouth 2 (two) times daily as needed (stomach pain).    [provider]  ibuprofen  (ADVIL ,MOTRIN ) 200 MG tablet Take 400 mg by mouth daily as needed for headache, mild pain or moderate pain.     [provider]  Melatonin 10 MG TABS Take 20 mg by mouth at bedtime.    [provider]  ondansetron  (ZOFRAN ) 4 MG tablet Take 1 tablet (4 mg total) by mouth every 8 (eight) hours as needed for nausea. 10/14/17   Sheldon Standing, MD  oxymetazoline  (AFRIN) 0.05 % nasal spray Place 1 spray into both nostrils daily as needed for congestion.    [provider]  promethazine  (PHENERGAN ) 25 MG suppository Place 1 suppository (25 mg total) rectally every 8 (eight) hours as needed for nausea or vomiting. 10/14/17   Sheldon Standing, MD  traMADol  (ULTRAM ) 50 MG tablet Take 1-2 tablets (50-100 mg total) by mouth every 6 (six) hours as needed for moderate pain or severe pain. 10/14/17   Sheldon Standing, MD    Allergies: Patient has no known allergies.    Review of Systems  Updated Vital Signs BP (!) 133/91   Pulse (!) 111   Temp 98.1 F (36.7 C) (Oral)   Resp 14   Ht 5' 11 (1.803 m)   Wt 113.4 kg   SpO2 98%   BMI 34.87 kg/m   Physical Exam Constitutional:      Comments: Patient is alert nontoxic.  Hyperventilating.  Mental status clear.  Lightly tremulous.  HENT:     Head: Normocephalic  and atraumatic.     Mouth/Throat:     Pharynx: Oropharynx is clear.  Eyes:     Extraocular Movements: Extraocular movements intact.  Cardiovascular:     Rate and Rhythm: Regular rhythm. Tachycardia present.     Heart sounds: Normal heart sounds.  Pulmonary:     Effort: Pulmonary effort is normal.     Breath sounds: Normal breath sounds.  Abdominal:     General: There is no distension.     Palpations: Abdomen is soft.     Tenderness: There is no abdominal tenderness. There is no guarding.  Musculoskeletal:        General: No swelling or tenderness. Normal range of motion.     Right lower leg: No edema.     Left lower leg: No edema.  Skin:    General: Skin is warm and dry.  Neurological:     General: No focal deficit present.     Mental Status: He is oriented to person, place, and time.     Motor:  No weakness.     Coordination: Coordination normal.  Psychiatric:        Mood and Affect: Mood normal.     (all labs ordered are listed, but only abnormal results are displayed) Labs Reviewed  COMPREHENSIVE METABOLIC PANEL WITH GFR - Abnormal; Notable for the following components:      Result Value   Glucose, Bld 238 (*)    Alkaline Phosphatase 128 (*)    All other components within normal limits  CBC WITH DIFFERENTIAL/PLATELET - Abnormal; Notable for the following components:   WBC 15.9 (*)    Neutro Abs 11.2 (*)    Monocytes Absolute 1.2 (*)    All other components within normal limits  PRO BRAIN NATRIURETIC PEPTIDE  TROPONIN T, HIGH SENSITIVITY    EKG: EKG Interpretation Date/Time:  Sunday February 27 2024 22:40:37 EST Ventricular Rate:  129 PR Interval:  144 QRS Duration:  99 QT Interval:  308 QTC Calculation: 452 R Axis:   31  Text Interpretation: Sinus tachycardia Abnormal R-wave progression, late transition Borderline T abnormalities, inferior leads no STEMI. possible subtle II st depression but appears rate related. Confirmed by Armenta Canning [phone]) on 02/27/2024 10:48:52 PM  Radiology: [facility] Chest Port 1 View Result Date: 02/27/2024 EXAM: 1 VIEW(S) XRAY OF THE CHEST 02/27/2024 11:14:00 PM COMPARISON: None available. CLINICAL HISTORY: sob FINDINGS: LUNGS AND PLEURA: Low lung volumes. No focal pulmonary opacity. No pleural effusion. No pneumothorax. HEART AND MEDIASTINUM: No acute abnormality of the cardiac and mediastinal silhouettes. BONES AND SOFT TISSUES: No acute osseous abnormality. IMPRESSION: 1. Low lung volumes. Electronically signed by: Dorethia Molt MD 02/27/2024 11:35 PM EST RP Workstation: [id number]    {Document cardiac monitor, telemetry assessment procedure when appropriate:32947} Procedures   Medications Ordered in the ED  iohexol  (OMNIPAQUE ) 350 MG/ML injection 75 mL (has no administration in time range)  LORazepam  (ATIVAN ) injection 0.5 mg (0.5 mg  Intravenous Given 02/27/24 2316)      {Click here for ABCD2, HEART and other calculators REFRESH Note before signing:1}                              Medical Decision Making Amount and/or Complexity of Data Reviewed Labs: ordered. Radiology: ordered.  Risk Prescription drug management.   Patient presents as outlined.  Will proceed with diagnostic evaluation to rule out ACS\PE\pneumothorax.  Chest x-ray reviewed by myself no acute appearance of mediastinal  widening no pneumothorax no consolidation.  {Document critical care time when appropriate  Document review of labs and clinical decision tools ie CHADS2VASC2, etc  Document your independent review of radiology images and any outside records  Document your discussion with family members, caretakers and with consultants  Document social determinants of health affecting pt's care  Document your decision making why or why not admission, treatments were needed:32947:::1}   Final diagnoses:  None    ED Discharge Orders     None

## 2024-02-28 LAB — TROPONIN T, HIGH SENSITIVITY: Troponin T High Sensitivity: <15 ng/L (ref 0–19)

## 2024-02-28 MED ORDER — SODIUM CHLORIDE 0.9 % IV BOLUS
500.0000 mL | Freq: Once | INTRAVENOUS | Status: AC
  Administered 2024-02-28: 500 mL via INTRAVENOUS

## 2024-02-28 NOTE — ED Provider Notes (Signed)
 I assumed care of this patient from previous provider.  Please see their note for further details of history, exam, and MDM.   Briefly patient is a 48 y.o. male who presented with tachycardia and chest pain.  EKG with sinus tachycardia, initial troponin negative.  Currently awaiting rest of the labs and CT scan to rule out PE.  CBC with mild leukocytosis.  No anemia.  No electrolyte derangements.  No renal insufficiency.  Second troponin negative.  BNP not concerning for heart failure no pulmonary edema on x-ray.  CT scan negative for PE or other intrathoracic infectious or inflammatory processes.  Provided patient with IV fluids and heart rate down to the 90s.  He feels significantly better.  PCP follow-up recommended.  The patient appears reasonably screened and/or stabilized for discharge and I doubt any other medical condition or other [facility] requiring further screening, evaluation, or treatment in the ED at this time. I have discussed the findings, Dx and Tx plan with the patient/family who expressed understanding and agree(s) with the plan. Discharge instructions discussed at length. The patient/family was given strict return precautions who verbalized understanding of the instructions. No further questions at time of discharge.  Disposition: Discharge  Condition: Good  ED Discharge Orders     None        Follow Up: Primary care provider  Call  to schedule an appointment for close follow up         Dylin Breeden, Raynell Moder, MD 02/28/24 [phone]
